# Patient Record
Sex: Female | Born: 1995 | Race: White | Hispanic: No | Marital: Married | Smoking: Never smoker
Health system: Southern US, Community
[De-identification: ages and names within clinical notes are randomized; demographics above are authoritative.]

## PROBLEM LIST (undated history)

## (undated) DIAGNOSIS — J45909 Unspecified asthma, uncomplicated: Secondary | ICD-10-CM

## (undated) DIAGNOSIS — N390 Urinary tract infection, site not specified: Secondary | ICD-10-CM

## (undated) DIAGNOSIS — D649 Anemia, unspecified: Secondary | ICD-10-CM

## (undated) DIAGNOSIS — F32A Depression, unspecified: Secondary | ICD-10-CM

## (undated) DIAGNOSIS — R519 Headache, unspecified: Secondary | ICD-10-CM

## (undated) DIAGNOSIS — F419 Anxiety disorder, unspecified: Secondary | ICD-10-CM

## (undated) DIAGNOSIS — U071 COVID-19: Secondary | ICD-10-CM

## (undated) DIAGNOSIS — E282 Polycystic ovarian syndrome: Secondary | ICD-10-CM

## (undated) HISTORY — DX: Headache, unspecified: R51.9

## (undated) HISTORY — PX: OTHER SURGICAL HISTORY: SHX169

## (undated) HISTORY — PX: TONSILLECTOMY: SUR1361

---
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
## 2018-02-12 ENCOUNTER — Other Ambulatory Visit: Payer: Self-pay | Admitting: Chiropractic Medicine

## 2018-02-12 ENCOUNTER — Ambulatory Visit
Admission: RE | Admit: 2018-02-12 | Discharge: 2018-02-12 | Disposition: A | Discharge status: Home / Self Care | Payer: 59 | Source: Ambulatory Visit | Attending: Chiropractic Medicine | Admitting: Chiropractic Medicine

## 2018-02-12 DIAGNOSIS — M62838 Other muscle spasm: Secondary | ICD-10-CM

## 2018-02-12 DIAGNOSIS — M542 Cervicalgia: Principal | ICD-10-CM

## 2018-02-12 DIAGNOSIS — G8929 Other chronic pain: Secondary | ICD-10-CM

## 2019-09-02 ENCOUNTER — Encounter: Payer: Self-pay | Admitting: Obstetrics and Gynecology

## 2020-02-11 ENCOUNTER — Encounter: Payer: Self-pay | Admitting: Obstetrics and Gynecology

## 2020-02-17 ENCOUNTER — Telehealth: Payer: Self-pay

## 2020-02-17 ENCOUNTER — Other Ambulatory Visit: Payer: Self-pay | Admitting: *Deleted

## 2020-02-17 ENCOUNTER — Other Ambulatory Visit: Payer: Self-pay | Admitting: Obstetrics and Gynecology

## 2020-02-17 DIAGNOSIS — Z3A13 13 weeks gestation of pregnancy: Secondary | ICD-10-CM

## 2020-02-17 DIAGNOSIS — O283 Abnormal ultrasonic finding on antenatal screening of mother: Secondary | ICD-10-CM

## 2020-02-17 DIAGNOSIS — Z363 Encounter for antenatal screening for malformations: Secondary | ICD-10-CM

## 2020-02-17 NOTE — Telephone Encounter (Signed)
Called patient LVM of appointment on 03/01/20 at 7:30am per Dr. Grace Bushy schedule patient for early Anatomy and GC.     If patient calls to re-schedule needs to be scheduled up to [redacted] week GA

## 2020-02-22 ENCOUNTER — Encounter: Payer: Self-pay | Admitting: *Deleted

## 2020-03-01 ENCOUNTER — Other Ambulatory Visit: Payer: 59

## 2020-03-01 ENCOUNTER — Ambulatory Visit: Payer: 59

## 2020-03-05 ENCOUNTER — Encounter: Payer: Self-pay | Admitting: Obstetrics & Gynecology

## 2020-03-08 ENCOUNTER — Encounter (HOSPITAL_BASED_OUTPATIENT_CLINIC_OR_DEPARTMENT_OTHER): Payer: Self-pay | Admitting: Obstetrics & Gynecology

## 2020-03-08 ENCOUNTER — Other Ambulatory Visit (HOSPITAL_COMMUNITY): Payer: Self-pay | Admitting: Obstetrics & Gynecology

## 2020-03-08 ENCOUNTER — Other Ambulatory Visit: Payer: Self-pay | Admitting: Obstetrics & Gynecology

## 2020-03-08 ENCOUNTER — Other Ambulatory Visit: Payer: Self-pay

## 2020-03-08 DIAGNOSIS — Z0373 Encounter for suspected fetal anomaly ruled out: Secondary | ICD-10-CM

## 2020-03-08 NOTE — Progress Notes (Signed)
Spoke w/ via phone for pre-op interview---pt Lab needs dos---- cbc t & s              Lab results------none COVID test ------03-09-2020 900 am Arrive at -------530 am 03-11-2020 NPO after MN No Solid Food.  Clear liquids from MN until---430 am then npo Medications to take morning of surgery -----none Diabetic medication -----n/a Patient Special Instructions -----none Pre-Op special Istructions -----none Patient verbalized understanding of instructions that were given at this phone interview. Patient denies shortness of breath, chest pain, fever, cough at this phone interview.

## 2020-03-09 ENCOUNTER — Other Ambulatory Visit (HOSPITAL_COMMUNITY)
Admission: RE | Admit: 2020-03-09 | Discharge: 2020-03-09 | Disposition: A | Discharge status: Home / Self Care | Payer: Managed Care, Other (non HMO) | Source: Ambulatory Visit | Attending: Obstetrics & Gynecology | Admitting: Obstetrics & Gynecology

## 2020-03-09 DIAGNOSIS — Z20822 Contact with and (suspected) exposure to covid-19: Secondary | ICD-10-CM | POA: Insufficient documentation

## 2020-03-09 DIAGNOSIS — Z01812 Encounter for preprocedural laboratory examination: Secondary | ICD-10-CM | POA: Insufficient documentation

## 2020-03-09 LAB — SARS CORONAVIRUS 2 (TAT 6-24 HRS): SARS Coronavirus 2: NEGATIVE

## 2020-03-09 NOTE — H&P (Signed)
Jody Watson is an 25 y.o. female G1, 14 wks, with fetal anomaly of bilateral cystic hygromas, is here for pregnancy termination. She had NIPT done few days, result is pending but due to high likelihood of fetal anomaly, couple chose to proceed with pregnancy termination.   O(+) per records from prior Gyn Rub NonImmune VZ NonImmune   Patient's last menstrual period was 12/01/2019.    Past Medical History:  Diagnosis Date  . Anemia    none recent  . Anxiety   . Asthma    no inhalers used  . COVID jan 18th 2022   rapid home sob stomach pain n/v diarrhea x 2 weeks allsymptoms resoleved  . Depression     Past Surgical History:  Procedure Laterality Date  . TONSILLECTOMY  as child  . wisdome teeth extraction  age 60    Family History  Problem Relation Age of Onset  . Asthma Father   . Diabetes Father   . Alcohol abuse Maternal Grandfather   . Diabetes Paternal Grandfather     Social History:  reports that she has never smoked. She has never used smokeless tobacco. She reports that she does not drink alcohol and does not use drugs.  Allergies:  Allergies  Allergen Reactions  . Bee Venom Anaphylaxis    No medications prior to admission.    Review of Systems  Height 5\' 3"  (1.6 m), weight 83.9 kg, last menstrual period 12/01/2019, unknown if currently breastfeeding. Physical Exam  Results for orders placed or performed during the hospital encounter of 03/09/20 (from the past 24 hour(s))  SARS CORONAVIRUS 2 (TAT 6-24 HRS) Nasopharyngeal Nasopharyngeal Swab     Status: None   Collection Time: 03/09/20  2:21 PM   Specimen: Nasopharyngeal Swab  Result Value Ref Range   SARS Coronavirus 2 NEGATIVE NEGATIVE    No results found. Physical exam:  A&O x 3, no acute distress. Pleasant HEENT neg, no thyromegaly Lungs CTA bilat CV RRR, S1S2 normal Abdo soft, non tender, non acute Extr no edema/ tenderness Pelvic cx closed   Assessment/Plan: 25 yo G1 at 13+ wks with  fetal malformation noting bilateral cystic hygromas. Possible aneuploidy and poor fetal outcome incl demise later in pregnancy d/w pt and decision is made to proceed with pregnancy termination with suction dilatation and evacuation.  Risks/complications of surgery reviewed incl infection, bleeding, damage to internal organs including bladder, bowels, ureters, blood vessels, other risks from anesthesia, VTE and delayed complications of any surgery, complications in future surgery reviewed.  25, MD  Pt seen and assessed,H&P and plan reviewed, no changes, declines karyotype  --V.Shea Evans, MD

## 2020-03-10 NOTE — Anesthesia Preprocedure Evaluation (Signed)
Anesthesia Evaluation  Patient identified by MRN, date of birth, ID band Patient awake    Reviewed: Allergy & Precautions, NPO status , Patient's Chart, lab work & pertinent test results  History of Anesthesia Complications Negative for: history of anesthetic complications  Airway Mallampati: III  TM Distance: >3 FB Neck ROM: Full    Dental  (+) Dental Advisory Given   Pulmonary asthma (has not needed inhaler in 2 years) ,  03/09/2020 SARS coronavirus NEG COVID positive 01/19/2020   breath sounds clear to auscultation       Cardiovascular negative cardio ROS   Rhythm:Regular Rate:Normal     Neuro/Psych negative neurological ROS     GI/Hepatic negative GI ROS, Neg liver ROS, neg GERD  ,  Endo/Other  negative endocrine ROS  Renal/GU negative Renal ROS     Musculoskeletal   Abdominal   Peds  Hematology negative hematology ROS (+)   Anesthesia Other Findings   Reproductive/Obstetrics (+) Pregnancy                            Anesthesia Physical Anesthesia Plan  ASA: II  Anesthesia Plan: General   Post-op Pain Management:    Induction: Intravenous  PONV Risk Score and Plan: Dexamethasone, Ondansetron and Scopolamine patch - Pre-op  Airway Management Planned: LMA  Additional Equipment: None  Intra-op Plan:   Post-operative Plan:   Informed Consent: I have reviewed the patients History and Physical, chart, labs and discussed the procedure including the risks, benefits and alternatives for the proposed anesthesia with the patient or authorized representative who has indicated his/her understanding and acceptance.     Dental advisory given  Plan Discussed with: CRNA and Surgeon  Anesthesia Plan Comments:        Anesthesia Quick Evaluation

## 2020-03-11 ENCOUNTER — Ambulatory Visit (HOSPITAL_BASED_OUTPATIENT_CLINIC_OR_DEPARTMENT_OTHER)
Admission: RE | Admit: 2020-03-11 | Discharge: 2020-03-11 | Disposition: A | Discharge status: Home / Self Care | Payer: Managed Care, Other (non HMO) | Attending: Obstetrics & Gynecology | Admitting: Obstetrics & Gynecology

## 2020-03-11 ENCOUNTER — Ambulatory Visit (HOSPITAL_BASED_OUTPATIENT_CLINIC_OR_DEPARTMENT_OTHER): Payer: Managed Care, Other (non HMO) | Admitting: Anesthesiology

## 2020-03-11 ENCOUNTER — Ambulatory Visit (HOSPITAL_COMMUNITY)
Admission: RE | Admit: 2020-03-11 | Discharge: 2020-03-11 | Disposition: A | Discharge status: Home / Self Care | Payer: Managed Care, Other (non HMO) | Source: Ambulatory Visit | Attending: Obstetrics & Gynecology | Admitting: Obstetrics & Gynecology

## 2020-03-11 ENCOUNTER — Encounter (HOSPITAL_BASED_OUTPATIENT_CLINIC_OR_DEPARTMENT_OTHER): Payer: Self-pay | Admitting: Obstetrics & Gynecology

## 2020-03-11 ENCOUNTER — Other Ambulatory Visit: Payer: Self-pay

## 2020-03-11 ENCOUNTER — Encounter (HOSPITAL_BASED_OUTPATIENT_CLINIC_OR_DEPARTMENT_OTHER)
Admission: RE | Disposition: A | Discharge status: Home / Self Care | Payer: Self-pay | Source: Home / Self Care | Attending: Obstetrics & Gynecology

## 2020-03-11 DIAGNOSIS — Z0373 Encounter for suspected fetal anomaly ruled out: Secondary | ICD-10-CM

## 2020-03-11 DIAGNOSIS — Z8616 Personal history of COVID-19: Secondary | ICD-10-CM | POA: Insufficient documentation

## 2020-03-11 DIAGNOSIS — Z87892 Personal history of anaphylaxis: Secondary | ICD-10-CM | POA: Diagnosis not present

## 2020-03-11 DIAGNOSIS — Z332 Encounter for elective termination of pregnancy: Secondary | ICD-10-CM | POA: Diagnosis not present

## 2020-03-11 DIAGNOSIS — Z91030 Bee allergy status: Secondary | ICD-10-CM | POA: Diagnosis not present

## 2020-03-11 DIAGNOSIS — O358XX0 Maternal care for other (suspected) fetal abnormality and damage, not applicable or unspecified: Secondary | ICD-10-CM | POA: Diagnosis present

## 2020-03-11 DIAGNOSIS — Z3A14 14 weeks gestation of pregnancy: Secondary | ICD-10-CM | POA: Insufficient documentation

## 2020-03-11 HISTORY — DX: Anemia, unspecified: D64.9

## 2020-03-11 HISTORY — DX: Depression, unspecified: F32.A

## 2020-03-11 HISTORY — PX: DILATION AND EVACUATION: SHX1459

## 2020-03-11 HISTORY — DX: Anxiety disorder, unspecified: F41.9

## 2020-03-11 HISTORY — DX: COVID-19: U07.1

## 2020-03-11 HISTORY — PX: OPERATIVE ULTRASOUND: SHX5996

## 2020-03-11 HISTORY — DX: Unspecified asthma, uncomplicated: J45.909

## 2020-03-11 LAB — CBC
HCT: 40.5 % (ref 36.0–46.0)
Hemoglobin: 13.4 g/dL (ref 12.0–15.0)
MCH: 27.4 pg (ref 26.0–34.0)
MCHC: 33.1 g/dL (ref 30.0–36.0)
MCV: 82.8 fL (ref 80.0–100.0)
Platelets: 247 10*3/uL (ref 150–400)
RBC: 4.89 MIL/uL (ref 3.87–5.11)
RDW: 13.9 % (ref 11.5–15.5)
WBC: 12.5 10*3/uL — ABNORMAL HIGH (ref 4.0–10.5)
nRBC: 0 % (ref 0.0–0.2)

## 2020-03-11 LAB — ABO/RH: ABO/RH(D): O POS

## 2020-03-11 LAB — TYPE AND SCREEN
ABO/RH(D): O POS
Antibody Screen: NEGATIVE

## 2020-03-11 SURGERY — DILATION AND EVACUATION, UTERUS
Anesthesia: General | Site: Vagina

## 2020-03-11 MED ORDER — FENTANYL CITRATE (PF) 100 MCG/2ML IJ SOLN
INTRAMUSCULAR | Status: DC | PRN
  Administered 2020-03-11 (×4): 50 ug via INTRAVENOUS

## 2020-03-11 MED ORDER — PROPOFOL 10 MG/ML IV BOLUS
INTRAVENOUS | Status: AC
  Filled 2020-03-11: qty 20

## 2020-03-11 MED ORDER — MIDAZOLAM HCL 2 MG/2ML IJ SOLN: 0.5000 mg | Freq: Once | INTRAMUSCULAR | Status: DC | PRN

## 2020-03-11 MED ORDER — MIDAZOLAM HCL 5 MG/5ML IJ SOLN
INTRAMUSCULAR | Status: DC | PRN
  Administered 2020-03-11: 2 mg via INTRAVENOUS

## 2020-03-11 MED ORDER — MEPERIDINE HCL 25 MG/ML IJ SOLN: 6.2500 mg | INTRAMUSCULAR | Status: DC | PRN

## 2020-03-11 MED ORDER — PROPOFOL 10 MG/ML IV BOLUS
INTRAVENOUS | Status: DC | PRN
  Administered 2020-03-11: 200 mg via INTRAVENOUS

## 2020-03-11 MED ORDER — SCOPOLAMINE 1 MG/3DAYS TD PT72
MEDICATED_PATCH | TRANSDERMAL | Status: AC
  Filled 2020-03-11: qty 1

## 2020-03-11 MED ORDER — CEFAZOLIN SODIUM-DEXTROSE 2-4 GM/100ML-% IV SOLN
INTRAVENOUS | Status: AC
  Filled 2020-03-11: qty 100

## 2020-03-11 MED ORDER — LIDOCAINE 2% (20 MG/ML) 5 ML SYRINGE
INTRAMUSCULAR | Status: AC
  Filled 2020-03-11: qty 5

## 2020-03-11 MED ORDER — FENTANYL CITRATE (PF) 100 MCG/2ML IJ SOLN
INTRAMUSCULAR | Status: AC
  Filled 2020-03-11: qty 2

## 2020-03-11 MED ORDER — HYDROMORPHONE HCL 1 MG/ML IJ SOLN
0.2500 mg | INTRAMUSCULAR | Status: DC | PRN
  Administered 2020-03-11: 0.25 mg via INTRAVENOUS

## 2020-03-11 MED ORDER — OXYCODONE HCL 5 MG PO TABS: 5.0000 mg | ORAL_TABLET | Freq: Once | ORAL | Status: DC | PRN

## 2020-03-11 MED ORDER — PROMETHAZINE HCL 25 MG/ML IJ SOLN: 6.2500 mg | INTRAMUSCULAR | Status: DC | PRN

## 2020-03-11 MED ORDER — IBUPROFEN 200 MG PO TABS: 600.0000 mg | ORAL_TABLET | Freq: Four times a day (QID) | ORAL | 0 refills | Status: DC | PRN

## 2020-03-11 MED ORDER — ACETAMINOPHEN 500 MG PO TABS
ORAL_TABLET | ORAL | Status: AC
  Filled 2020-03-11: qty 2

## 2020-03-11 MED ORDER — CHLOROPROCAINE HCL 1 % IJ SOLN
INTRAMUSCULAR | Status: DC | PRN
  Administered 2020-03-11: 20 mL

## 2020-03-11 MED ORDER — OXYCODONE HCL 5 MG/5ML PO SOLN
5.0000 mg | Freq: Once | ORAL | Status: DC | PRN
Start: 2020-03-11 — End: 2020-03-11

## 2020-03-11 MED ORDER — ONDANSETRON HCL 4 MG/2ML IJ SOLN
INTRAMUSCULAR | Status: AC
  Filled 2020-03-11: qty 2

## 2020-03-11 MED ORDER — DEXAMETHASONE SODIUM PHOSPHATE 10 MG/ML IJ SOLN
INTRAMUSCULAR | Status: AC
  Filled 2020-03-11: qty 1

## 2020-03-11 MED ORDER — ONDANSETRON HCL 4 MG/2ML IJ SOLN
INTRAMUSCULAR | Status: DC | PRN
  Administered 2020-03-11: 4 mg via INTRAVENOUS

## 2020-03-11 MED ORDER — DEXAMETHASONE SODIUM PHOSPHATE 10 MG/ML IJ SOLN
INTRAMUSCULAR | Status: DC | PRN
  Administered 2020-03-11: 10 mg via INTRAVENOUS

## 2020-03-11 MED ORDER — LIDOCAINE 2% (20 MG/ML) 5 ML SYRINGE
INTRAMUSCULAR | Status: DC | PRN
  Administered 2020-03-11: 100 mg via INTRAVENOUS

## 2020-03-11 MED ORDER — CEFAZOLIN SODIUM-DEXTROSE 2-4 GM/100ML-% IV SOLN
2.0000 g | INTRAVENOUS | Status: AC
  Administered 2020-03-11: 2 g via INTRAVENOUS

## 2020-03-11 MED ORDER — ACETAMINOPHEN 500 MG PO TABS
1000.0000 mg | ORAL_TABLET | Freq: Once | ORAL | Status: AC
  Administered 2020-03-11: 1000 mg via ORAL

## 2020-03-11 MED ORDER — LACTATED RINGERS IV SOLN: INTRAVENOUS | Status: DC

## 2020-03-11 MED ORDER — POVIDONE-IODINE 10 % EX SWAB: 2.0000 "application " | Freq: Once | CUTANEOUS | Status: DC

## 2020-03-11 MED ORDER — MIDAZOLAM HCL 2 MG/2ML IJ SOLN
INTRAMUSCULAR | Status: AC
  Filled 2020-03-11: qty 2

## 2020-03-11 MED ORDER — HYDROMORPHONE HCL 1 MG/ML IJ SOLN
INTRAMUSCULAR | Status: AC
  Filled 2020-03-11: qty 1

## 2020-03-11 MED ORDER — SCOPOLAMINE 1 MG/3DAYS TD PT72: 1.0000 | MEDICATED_PATCH | TRANSDERMAL | Status: DC

## 2020-03-11 MED ORDER — METHYLERGONOVINE MALEATE 0.2 MG/ML IJ SOLN
INTRAMUSCULAR | Status: DC | PRN
  Administered 2020-03-11: .2 mg via INTRAMUSCULAR

## 2020-03-11 MED ORDER — KETOROLAC TROMETHAMINE 30 MG/ML IJ SOLN
INTRAMUSCULAR | Status: DC | PRN
  Administered 2020-03-11: 30 mg via INTRAVENOUS

## 2020-03-11 MED ORDER — KETOROLAC TROMETHAMINE 30 MG/ML IJ SOLN
INTRAMUSCULAR | Status: AC
  Filled 2020-03-11: qty 1

## 2020-03-11 SURGICAL SUPPLY — 24 items
ADAPTER VACURETTE TBG SET 14 (CANNULA) ×3 IMPLANT
COVER WAND RF STERILE (DRAPES) ×3 IMPLANT
DECANTER SPIKE VIAL GLASS SM (MISCELLANEOUS) ×3 IMPLANT
GAUZE 4X4 16PLY RFD (DISPOSABLE) ×3 IMPLANT
GLOVE SURG ENC MOIS LTX SZ7 (GLOVE) ×3 IMPLANT
GLOVE SURG UNDER POLY LF SZ7 (GLOVE) ×6 IMPLANT
GOWN STRL REUS W/TWL LRG LVL3 (GOWN DISPOSABLE) ×6 IMPLANT
KIT BERKELEY 1ST TRI 3/8 NO TR (MISCELLANEOUS) ×3 IMPLANT
KIT BERKELEY 1ST TRIMESTER 3/8 (MISCELLANEOUS) IMPLANT
KIT TURNOVER CYSTO (KITS) ×3 IMPLANT
Med GYN Disposable Rigid Curette Curved ×6 IMPLANT
NS IRRIG 1000ML POUR BTL (IV SOLUTION) IMPLANT
NS IRRIG 500ML POUR BTL (IV SOLUTION) ×3 IMPLANT
PACK VAGINAL MINOR WOMEN LF (CUSTOM PROCEDURE TRAY) ×3 IMPLANT
PAD OB MATERNITY 4.3X12.25 (PERSONAL CARE ITEMS) ×3 IMPLANT
PAD PREP 24X48 CUFFED NSTRL (MISCELLANEOUS) ×3 IMPLANT
SET BERKELEY SUCTION TUBING (SUCTIONS) ×3 IMPLANT
SUT VICRYL 0 UR6 27IN ABS (SUTURE) ×3 IMPLANT
TOWEL OR 17X26 10 PK STRL BLUE (TOWEL DISPOSABLE) ×3 IMPLANT
TUBE VACURETTE 2ND TRIMESTER (CANNULA) ×6 IMPLANT
VACURETTE 10 RIGID CVD (CANNULA) IMPLANT
VACURETTE 7MM CVD STRL WRAP (CANNULA) IMPLANT
VACURETTE 8 RIGID CVD (CANNULA) IMPLANT
VACURETTE 9 RIGID CVD (CANNULA) IMPLANT

## 2020-03-11 NOTE — Discharge Instructions (Signed)
Post Anesthesia Home Care Instructions  Activity: Get plenty of rest for the remainder of the day. A responsible individual must stay with you for 24 hours following the procedure.  For the next 24 hours, DO NOT: -Drive a car -Operate machinery -Drink alcoholic beverages -Take any medication unless instructed by your physician -Make any legal decisions or sign important papers.  Meals: Start with liquid foods such as gelatin or soup. Progress to regular foods as tolerated. Avoid greasy, spicy, heavy foods. If nausea and/or vomiting occur, drink only clear liquids until the nausea and/or vomiting subsides. Call your physician if vomiting continues.  Special Instructions/Symptoms: Your throat may feel dry or sore from the anesthesia or the breathing tube placed in your throat during surgery. If this causes discomfort, gargle with warm salt water. The discomfort should disappear within 24 hours.  If you had a scopolamine patch placed behind your ear for the management of post- operative nausea and/or vomiting:  1. The medication in the patch is effective for 72 hours, after which it should be removed.  Wrap patch in a tissue and discard in the trash. Wash hands thoroughly with soap and water. 2. You may remove the patch earlier than 72 hours if you experience unpleasant side effects which may include dry mouth, dizziness or visual disturbances. 3. Avoid touching the patch. Wash your hands with soap and water after contact with the patch.        Dilation and Curettage or Vacuum Curettage, Care After This sheet gives you information about how to care for yourself after your procedure. Your doctor may also give you more specific instructions. If you have problems or questions, contact your doctor. What can I expect after the procedure? After the procedure, it is common to have:  Mild pain or cramping.  Some bleeding or spotting from the vagina. These may last for up to 2 weeks. Follow  these instructions at home: Medicines  Take over-the-counter and prescription medicines only as told by your doctor. This is very important if you take blood-thinning medicine.  Ask your doctor if the medicine prescribed to you requires you to avoid driving or using machinery. Activity  If you were given a medicine to help you relax (sedative) during your procedure, it can affect you for many hours. Do not drive or use machinery until your doctor says that it is safe.  Rest as told by your doctor.  Do not sit for a long time without moving. Get up to take short walks every 1-2 hours. This is important. Ask for help if you feel weak or unsteady.  Do not lift anything that is heavier than 10 lb (4.5 kg), or the limit that you are told, until your doctor says that it is safe.  Return to your normal activities as told by your doctor. Ask your doctor what activities are safe for you.   Lifestyle For at least 2 weeks, or as long as told by your doctor:  Do not douche.  Do not use tampons.  Do not have sex. General instructions  Wear compression stockings as told by your doctor.  It is up to you to get the results of your procedure. Ask your doctor, or the department that is doing the procedure, when your results will be ready.  Keep all follow-up visits as told by your doctor. This is important. Contact a doctor if:  You have very bad cramps that get worse or do not get better with medicine.  You   have very bad pain in your belly (abdomen).  You cannot drink fluids without vomiting.  You have pain in a different part of your pelvis. The pelvis is the area just above your thighs.  You have fluid from your vagina that smells bad.  You have a rash. Get help right away if:  You are bleeding a lot from your vagina. A lot of bleeding means soaking more than one sanitary pad in 1 hour for 2 hours in a row.  You have a fever that is above 100.4F (38.0C).  Your belly feels very  tender or hard.  You have chest pain.  You have trouble breathing.  You feel dizzy.  You feel light-headed.  You pass out (faint).  You have pain in your neck or shoulder area. These symptoms may be an emergency. Do not wait to see if the symptoms will go away. Get medical help right away. Call your local emergency services (911 in the [place] Do not drive yourself to the hospital. Summary  After your procedure, it is common to have pain or cramping. It is also common to have bleeding or spotting from your vagina.  Rest as told. Do not sit for a long time without moving. Get up to take short walks every 1-2 hours.  Do not lift anything that is heavier than 10 lb (4.5 kg), or the limit that you are told.  Contact your doctor if you have fluid from your vagina that smells bad.  Get help right away if you develop any problems from the procedure. Ask your doctor what problems to watch for. This information is not intended to replace advice given to you by your health care provider. Make sure you discuss any questions you have with your health care provider. Document Revised: 01/20/2019 Document Reviewed: 01/20/2019 Elsevier Patient Education  2021 [employer]  

## 2020-03-11 NOTE — Anesthesia Postprocedure Evaluation (Signed)
Anesthesia Post Note  Patient: Almedia Cordell  Procedure(s) Performed: DILATATION AND EVACUATION (N/A Vagina ) OPERATIVE ULTRASOUND (N/A Abdomen)     Patient location during evaluation: Phase II Anesthesia Type: General Level of consciousness: awake and alert, patient cooperative and oriented Pain management: pain level controlled Vital Signs Assessment: post-procedure vital signs reviewed and stable Respiratory status: spontaneous breathing, nonlabored ventilation and respiratory function stable Cardiovascular status: blood pressure returned to baseline and stable Postop Assessment: no apparent nausea or vomiting, able to ambulate and adequate PO intake Anesthetic complications: no   No complications documented.  Last Vitals:  Vitals:   03/11/20 0930 03/11/20 1015  BP: 124/82 119/78  Pulse: 95 92  Resp: 12 16  Temp:  36.6 C  SpO2: 99% 98%    Last Pain:  Vitals:   03/11/20 1015  TempSrc:   PainSc: 1                  Josip Merolla,E. Jeylin Woodmansee

## 2020-03-11 NOTE — Transfer of Care (Signed)
Immediate Anesthesia Transfer of Care Note  Patient: Jody Watson  Procedure(s) Performed: DILATATION AND EVACUATION (N/A Vagina ) OPERATIVE ULTRASOUND (N/A Abdomen)  Patient Location: PACU  Anesthesia Type:General  Level of Consciousness: awake, alert , oriented and patient cooperative  Airway & Oxygen Therapy: Patient Spontanous Breathing  Post-op Assessment: Report given to RN and Post -op Vital signs reviewed and stable  Post vital signs: Reviewed and stable  Last Vitals:  Vitals Value Taken Time  BP 116/79 03/11/20 0845  Temp 37.1 C 03/11/20 0844  Pulse 105 03/11/20 0848  Resp 15 03/11/20 0848  SpO2 98 % 03/11/20 0848  Vitals shown include unvalidated device data.  Last Pain:  Vitals:   03/11/20 0538  TempSrc: Oral         Complications: No complications documented.

## 2020-03-11 NOTE — Op Note (Signed)
Preoperative diagnosis: 14 weeks pregnancy, fetal anomaly, bilateral cystic hygromas Postop diagnosis: as above.  Procedure: Suction D&E with ultrasound guidance  Anesthesia General LMA and 20 cc Nesacaine Paracervical block  Surgeon: Shea Evans, MD Assistant: none  IV fluids 1000 cc LR Estimated blood loss 700 cc in suction canister  Urine output: voided preop Complications none Condition stable Disposition PACU  Specimen: products of conception sent to pathology.  Procedure Indication: 25 yo with early pregnancy noting bilateral cystic hygromas at another practice and we noted same findings but worsening cystic hydromas involving entire crown rum length at 13 weeks. NIPT was done but result not back. Couple chose to proceed with pregnancy termination due to ultrasound findings and that NIPS would not rule out all chromosomal abnormalities. She declined karyotype on specimen today.   Decision was made to proceed with suction D&E. Risks/ complications of surgery including infection, bleeding, damage to internal organs including uterine perforation, Asherman syndrome were reviewed. She voiced understanding and gave informed written consent.  Patient was brought to the operating room with IV running. She received preop 2 gm Ancef.  She underwent general anesthesia with LMA without complications. She was given dorsolithotomy position. Parts were prepped and draped in standard fashion. Bimanual exam and ultrasound noted revealed uterus to be anteverted and 14 week size.  [employer] consultant used to expose cervix  and was grasped with single-tooth tenaculum. Bilateral cervical block given with 20 cc plain Nesacaine.  Cervical os dilates with some difficulty as patient didn't take Cytotec to prep her cervix. Ultrasound guidance helped. Cervix was gradually dilated to #41 [place] dilator. Cervical bucket handle tear was created as I tried last dilated and was stitched with 0-Vocryl at the end  with gret hemostasis.  A # 14 suction curette was introduced in the uterine cavity and suction evacuation of products of conception was done in multiple step wise fashion. Some tougher parts removed with Ring clamps. Gentle sharp curettage was performed. Suction cannula was reintroduced and cavity was emptied that was confirmed with ultrasound.  There was no active bleeding but Methergine 0.2 mg IM given as prophylaxis considering gestational age. Cervical laceration (anterior lip, tenaculum tear) was sutured with two figure of 8 stitches of 0-Vicryl. Procedure was complete.  All counts are correct x2. Specimen products of conception. Patient brought to the recovery room in stable condition.   Patient will be discharged home today. Findings and warning signs and follow up, restrictions discussed with couple in recovery room. I performed this surgery.  V.Kali Deadwyler, MD.

## 2020-03-11 NOTE — Anesthesia Procedure Notes (Signed)
Procedure Name: LMA Insertion Date/Time: 03/11/2020 7:43 AM Performed by: Bishop Limbo, CRNA Pre-anesthesia Checklist: Patient identified, Emergency Drugs available, Suction available and Patient being monitored Patient Re-evaluated:Patient Re-evaluated prior to induction Oxygen Delivery Method: Circle System Utilized Preoxygenation: Pre-oxygenation with 100% oxygen Induction Type: IV induction Ventilation: Mask ventilation without difficulty LMA: LMA inserted LMA Size: 4.0 Number of attempts: 1 Placement Confirmation: positive ETCO2 Tube secured with: Tape Dental Injury: Teeth and Oropharynx as per pre-operative assessment

## 2020-03-14 ENCOUNTER — Encounter (HOSPITAL_BASED_OUTPATIENT_CLINIC_OR_DEPARTMENT_OTHER): Payer: Self-pay | Admitting: Obstetrics & Gynecology

## 2020-03-14 LAB — SURGICAL PATHOLOGY

## 2020-08-30 ENCOUNTER — Other Ambulatory Visit: Payer: Self-pay | Admitting: Obstetrics & Gynecology

## 2020-08-30 ENCOUNTER — Telehealth: Payer: Self-pay

## 2020-08-30 DIAGNOSIS — O30031 Twin pregnancy, monochorionic/diamniotic, first trimester: Secondary | ICD-10-CM

## 2020-08-30 NOTE — Telephone Encounter (Signed)
Called pt to schedule twin detail with our clinic. Left vm for pt to call back and schedule.

## 2020-09-29 ENCOUNTER — Encounter: Payer: Self-pay | Admitting: *Deleted

## 2020-09-30 ENCOUNTER — Encounter: Payer: Self-pay | Admitting: *Deleted

## 2020-09-30 ENCOUNTER — Ambulatory Visit: Payer: Managed Care, Other (non HMO) | Attending: Obstetrics & Gynecology

## 2020-09-30 ENCOUNTER — Other Ambulatory Visit: Payer: Self-pay | Admitting: *Deleted

## 2020-09-30 ENCOUNTER — Ambulatory Visit: Payer: Managed Care, Other (non HMO) | Admitting: *Deleted

## 2020-09-30 ENCOUNTER — Ambulatory Visit (HOSPITAL_BASED_OUTPATIENT_CLINIC_OR_DEPARTMENT_OTHER): Payer: Managed Care, Other (non HMO) | Admitting: Obstetrics

## 2020-09-30 ENCOUNTER — Other Ambulatory Visit: Payer: Self-pay

## 2020-09-30 VITALS — BP 133/78 | HR 130

## 2020-09-30 DIAGNOSIS — O30002 Twin pregnancy, unspecified number of placenta and unspecified number of amniotic sacs, second trimester: Secondary | ICD-10-CM | POA: Insufficient documentation

## 2020-09-30 DIAGNOSIS — O352XX0 Maternal care for (suspected) hereditary disease in fetus, not applicable or unspecified: Secondary | ICD-10-CM | POA: Diagnosis not present

## 2020-09-30 DIAGNOSIS — O30032 Twin pregnancy, monochorionic/diamniotic, second trimester: Secondary | ICD-10-CM

## 2020-09-30 DIAGNOSIS — Z3A19 19 weeks gestation of pregnancy: Secondary | ICD-10-CM | POA: Diagnosis present

## 2020-09-30 DIAGNOSIS — O30039 Twin pregnancy, monochorionic/diamniotic, unspecified trimester: Secondary | ICD-10-CM

## 2020-09-30 DIAGNOSIS — O30031 Twin pregnancy, monochorionic/diamniotic, first trimester: Secondary | ICD-10-CM | POA: Diagnosis present

## 2020-09-30 DIAGNOSIS — Z3689 Encounter for other specified antenatal screening: Secondary | ICD-10-CM | POA: Diagnosis not present

## 2020-09-30 NOTE — Progress Notes (Signed)
MFM Note  Jody Watson was seen due to a spontaneously conceived twin pregnancy.  Her last pregnancy was complicated by a fetus with bilateral cystic hygromas.  She denies any other significant past medical history and denies any problems in her current pregnancy.  She reports that she had a quad screen that showed a low risk for Down syndrome and trisomy 6.  A thin dividing membrane was noted separating the two fetuses along with a single placenta, indicating that these are monochorionic, diamniotic twins.  The fetal growth and amniotic fluid level appeared appropriate for her gestational age for both fetuses.  The views of the fetal anatomy were suboptimal today due to the fetal positions.   The limitations of ultrasound in the detection of all anomalies including fetal aneuploidy was discussed with the patient today.  The implications and management of monochorionic twins was discussed. The 10% to 15% risk of twin to twin transfusion syndrome seen in monochorionic, diamniotic twins was discussed today.  The implications and management of twin to twin transfusion syndrome (TTTS) should she develop this complication was also discussed.  She was advised that we will continue to follow her closely with serial ultrasounds to assess for signs of TTTS.  She was advised that management of twin pregnancies will involve frequent ultrasound exams to assess the fetal growth and amniotic fluid level.  We will continue to follow her with ultrasounds once every 2 weeks to assess for signs of TTTS. Weekly fetal testing should be started at around 32 weeks.  Delivery for uncomplicated monochorionic twins is recommended at around 37 weeks.  The increased risk of preeclampsia, gestational diabetes, and preterm birth/labor associated with twin pregnancies was discussed.  She was advised that we will continue to follow her closely to assess for these conditions.   As pregnancies with multiple gestations are at  increased risk for developing preeclampsia, she was advised to start taking a daily baby aspirin (81 mg per day) to decrease her risk of developing preeclampsia  as soon as possible.  A follow-up exam was scheduled in 2 weeks.   She already has a fetal echocardiogram scheduled with [facility] on November 03, 2020.  A total of 30 minutes was spent counseling and coordinating the care for this patient.  Greater than 50% of the time was spent in direct face-to-face contact.  Recommendations:  Ultrasounds every 2 weeks to screen for TTTS Fetal echocardiogram Start daily baby aspirin (81 mg) for preeclampsia prophylaxis Weekly fetal testing starting at 32 weeks Delivery by 37 weeks

## 2020-10-14 ENCOUNTER — Encounter: Payer: Self-pay | Admitting: *Deleted

## 2020-10-14 ENCOUNTER — Other Ambulatory Visit: Payer: Self-pay

## 2020-10-14 ENCOUNTER — Ambulatory Visit: Payer: Managed Care, Other (non HMO) | Admitting: *Deleted

## 2020-10-14 ENCOUNTER — Ambulatory Visit: Payer: Managed Care, Other (non HMO) | Attending: Obstetrics

## 2020-10-14 VITALS — BP 121/68 | HR 109

## 2020-10-14 DIAGNOSIS — O30039 Twin pregnancy, monochorionic/diamniotic, unspecified trimester: Secondary | ICD-10-CM | POA: Diagnosis not present

## 2020-10-14 DIAGNOSIS — O352XX0 Maternal care for (suspected) hereditary disease in fetus, not applicable or unspecified: Secondary | ICD-10-CM

## 2020-10-14 DIAGNOSIS — O30032 Twin pregnancy, monochorionic/diamniotic, second trimester: Secondary | ICD-10-CM | POA: Diagnosis not present

## 2020-10-14 DIAGNOSIS — Z3A21 21 weeks gestation of pregnancy: Secondary | ICD-10-CM | POA: Diagnosis not present

## 2020-10-28 ENCOUNTER — Ambulatory Visit: Payer: Managed Care, Other (non HMO) | Admitting: *Deleted

## 2020-10-28 ENCOUNTER — Other Ambulatory Visit: Payer: Self-pay | Admitting: *Deleted

## 2020-10-28 ENCOUNTER — Encounter: Payer: Self-pay | Admitting: *Deleted

## 2020-10-28 ENCOUNTER — Ambulatory Visit: Payer: Managed Care, Other (non HMO) | Attending: Obstetrics

## 2020-10-28 ENCOUNTER — Other Ambulatory Visit: Payer: Self-pay

## 2020-10-28 VITALS — BP 133/79 | HR 115

## 2020-10-28 DIAGNOSIS — Z362 Encounter for other antenatal screening follow-up: Secondary | ICD-10-CM

## 2020-10-28 DIAGNOSIS — O30039 Twin pregnancy, monochorionic/diamniotic, unspecified trimester: Secondary | ICD-10-CM

## 2020-10-28 DIAGNOSIS — Z3A23 23 weeks gestation of pregnancy: Secondary | ICD-10-CM | POA: Diagnosis not present

## 2020-10-28 DIAGNOSIS — O352XX0 Maternal care for (suspected) hereditary disease in fetus, not applicable or unspecified: Secondary | ICD-10-CM

## 2020-10-28 DIAGNOSIS — O30032 Twin pregnancy, monochorionic/diamniotic, second trimester: Secondary | ICD-10-CM

## 2020-11-09 ENCOUNTER — Ambulatory Visit
Admission: EM | Admit: 2020-11-09 | Discharge: 2020-11-09 | Disposition: A | Discharge status: Home / Self Care | Payer: Managed Care, Other (non HMO) | Attending: Family Medicine | Admitting: Family Medicine

## 2020-11-09 ENCOUNTER — Other Ambulatory Visit: Payer: Self-pay

## 2020-11-09 DIAGNOSIS — Z20818 Contact with and (suspected) exposure to other bacterial communicable diseases: Secondary | ICD-10-CM | POA: Insufficient documentation

## 2020-11-09 DIAGNOSIS — J029 Acute pharyngitis, unspecified: Secondary | ICD-10-CM | POA: Insufficient documentation

## 2020-11-09 LAB — POCT RAPID STREP A (OFFICE): Rapid Strep A Screen: NEGATIVE

## 2020-11-09 NOTE — ED Provider Notes (Signed)
[facility]    CSN: [id number] Arrival date & time: 11/09/20  1259      History   Chief Complaint No chief complaint on file.   HPI Jody Watson is a 25 y.o. female.   Patient presenting today with 2-day history of left-sided sore throat, now with pain radiating up toward the ear.  She denies fever, chills, body aches, cough, congestion, chest tightness, shortness of breath, abdominal pain, nausea vomiting or diarrhea.  So far taking allergy medication and lozenges with minimal relief.  Does have a history of seasonal allergies, asthma compliant with regimen.  No known sick contacts recently.  Notes that she is [redacted] weeks pregnant.   Past Medical History:  Diagnosis Date   Anemia    none recent   Anxiety    Asthma    no inhalers used   COVID jan 18th 2022   rapid home sob stomach pain n/v diarrhea x 2 weeks allsymptoms resoleved   Depression    Headache     There are no problems to display for this patient.   Past Surgical History:  Procedure Laterality Date   DILATION AND EVACUATION N/A 03/11/2020   Procedure: DILATATION AND EVACUATION;  Surgeon: Azucena Fallen, MD;  Location: [facility];  Service: Gynecology;  Laterality: N/A;   OPERATIVE ULTRASOUND N/A 03/11/2020   Procedure: OPERATIVE ULTRASOUND;  Surgeon: Azucena Fallen, MD;  Location: [facility];  Service: Gynecology;  Laterality: N/A;   TONSILLECTOMY  as child   wisdome teeth extraction  age 25    OB History     Gravida  2   Para      Term      Preterm      AB  1   Living  0      SAB      IAB  1   Ectopic      Multiple      Live Births               Home Medications    Prior to Admission medications   Medication Sig Start Date End Date Taking? Authorizing Provider  aspirin EC 81 MG tablet Take 81 mg by mouth daily. Swallow whole.    [provider]  EPINEPHrine 0.3 mg/0.3 mL IJ SOAJ injection Inject 0.3 mg into the muscle as  needed for anaphylaxis.    [provider]  Pediatric Multivit-Minerals-C (MULTIVITAMIN GUMMIES CHILDRENS PO) Take 2 tablets by mouth daily.    [provider]  promethazine (PHENERGAN) 25 MG tablet Take 25 mg by mouth every 6 (six) hours as needed for nausea or vomiting.    [provider]  sertraline (ZOLOFT) 25 MG tablet Take 25 mg by mouth daily.    [provider]    Family History Family History  Problem Relation Age of Onset   Asthma Father    Diabetes Father    Alcohol abuse Maternal Grandfather    Diabetes Paternal Grandfather     Social History Social History   Tobacco Use   Smoking status: Never   Smokeless tobacco: Never  Vaping Use   Vaping Use: Never used  Substance Use Topics   Alcohol use: No   Drug use: No     Allergies   Bee venom   Review of Systems Review of Systems Per HPI  Physical Exam Triage Vital Signs ED Triage Vitals  Enc Vitals Group     BP 11/09/20 1421 117/82  Pulse Rate 11/09/20 1421 (!) 121     Resp 11/09/20 1421 18     Temp 11/09/20 1421 98.5 F (36.9 C)     Temp Source 11/09/20 1421 Oral     SpO2 11/09/20 1421 97 %     Weight --      Height --      Head Circumference --      Peak Flow --      Pain Score 11/09/20 1419 4     Pain Loc --      Pain Edu? --      Excl. in GC? --    No data found.  Updated Vital Signs BP 117/82 (BP Location: Right Arm)   Pulse (!) 121   Temp 98.5 F (36.9 C) (Oral)   Resp 18   LMP 12/01/2019   SpO2 97%   Visual Acuity Right Eye Distance:   Left Eye Distance:   Bilateral Distance:    Right Eye Near:   Left Eye Near:    Bilateral Near:     Physical Exam Vitals and nursing note reviewed.  Constitutional:      Appearance: Normal appearance. She is not ill-appearing.  HENT:     Head: Atraumatic.     Right Ear: Tympanic membrane normal.     Left Ear: Tympanic membrane normal.     Nose: Nose normal.     Mouth/Throat:     Mouth: Mucous  membranes are moist.     Pharynx: Oropharynx is clear. Posterior oropharyngeal erythema present. No oropharyngeal exudate.     Comments: Minimal posterior oropharyngeal erythema, no exudates, absent tonsils due to status post tonsillectomy.  Uvula midline, oral airway patent Eyes:     Extraocular Movements: Extraocular movements intact.     Conjunctiva/sclera: Conjunctivae normal.  Cardiovascular:     Rate and Rhythm: Normal rate and regular rhythm.     Heart sounds: Normal heart sounds.  Pulmonary:     Effort: Pulmonary effort is normal.     Breath sounds: Normal breath sounds. No wheezing or rales.  Musculoskeletal:        General: Normal range of motion.     Cervical back: Normal range of motion and neck supple.  Skin:    General: Skin is warm and dry.  Neurological:     Mental Status: She is alert and oriented to person, place, and time.  Psychiatric:        Mood and Affect: Mood normal.        Thought Content: Thought content normal.        Judgment: Judgment normal.     UC Treatments / Results  Labs (all labs ordered are listed, but only abnormal results are displayed) Labs Reviewed  CULTURE, GROUP A STREP [facility])  POCT RAPID STREP A (OFFICE)    EKG   Radiology No results found.  Procedures Procedures (including critical care time)  Medications Ordered in UC Medications - No data to display  Initial Impression / Assessment and Plan / UC Course  I have reviewed the triage vital signs and the nursing notes.  Pertinent labs & imaging results that were available during my care of the patient were reviewed by me and considered in my medical decision making (see chart for details).      Vitals and exam reassuring, rapid strep negative, throat culture pending.  She declines viral testing today.  Continue salt water gargles, throat lozenges, allergy regimen.  Return for acutely worsening symptoms. Final Clinical  Impressions(s) / UC Diagnoses   Final diagnoses:   Exposure to strep throat  Sore throat   Discharge Instructions   None    ED Prescriptions   None    PDMP not reviewed this encounter.   Volney American, [place] 11/09/20 1514

## 2020-11-09 NOTE — ED Triage Notes (Signed)
Patient states she has pain in her throat on the left side that started 2 days ago. Had tonsils were removed about 10 years ago and hasn't had strep throat. Also is having pain in left ear. She would like to be swabbed for strep and no covid or flu today.  She is [redacted] weeks pregnant.

## 2020-11-12 LAB — CULTURE, GROUP A STREP (THRC)

## 2020-11-14 ENCOUNTER — Other Ambulatory Visit: Payer: Self-pay

## 2020-11-14 ENCOUNTER — Other Ambulatory Visit: Payer: Self-pay | Admitting: *Deleted

## 2020-11-14 ENCOUNTER — Ambulatory Visit: Payer: Managed Care, Other (non HMO) | Attending: Obstetrics and Gynecology

## 2020-11-14 ENCOUNTER — Encounter: Payer: Self-pay | Admitting: *Deleted

## 2020-11-14 ENCOUNTER — Ambulatory Visit: Payer: Managed Care, Other (non HMO) | Admitting: *Deleted

## 2020-11-14 VITALS — BP 142/81 | HR 134

## 2020-11-14 DIAGNOSIS — O30039 Twin pregnancy, monochorionic/diamniotic, unspecified trimester: Secondary | ICD-10-CM | POA: Insufficient documentation

## 2020-11-14 DIAGNOSIS — Z3A25 25 weeks gestation of pregnancy: Secondary | ICD-10-CM | POA: Diagnosis not present

## 2020-11-14 DIAGNOSIS — O352XX0 Maternal care for (suspected) hereditary disease in fetus, not applicable or unspecified: Secondary | ICD-10-CM | POA: Diagnosis not present

## 2020-11-14 DIAGNOSIS — O30032 Twin pregnancy, monochorionic/diamniotic, second trimester: Secondary | ICD-10-CM

## 2020-11-28 ENCOUNTER — Ambulatory Visit: Payer: Managed Care, Other (non HMO) | Admitting: *Deleted

## 2020-11-28 ENCOUNTER — Ambulatory Visit: Payer: Managed Care, Other (non HMO) | Attending: Obstetrics and Gynecology

## 2020-11-28 ENCOUNTER — Other Ambulatory Visit: Payer: Self-pay

## 2020-11-28 ENCOUNTER — Other Ambulatory Visit: Payer: Self-pay | Admitting: Obstetrics & Gynecology

## 2020-11-28 ENCOUNTER — Encounter: Payer: Self-pay | Admitting: *Deleted

## 2020-11-28 VITALS — BP 132/82 | HR 131

## 2020-11-28 DIAGNOSIS — O30032 Twin pregnancy, monochorionic/diamniotic, second trimester: Secondary | ICD-10-CM

## 2020-11-28 DIAGNOSIS — Z3A27 27 weeks gestation of pregnancy: Secondary | ICD-10-CM

## 2020-11-28 DIAGNOSIS — O30039 Twin pregnancy, monochorionic/diamniotic, unspecified trimester: Secondary | ICD-10-CM | POA: Diagnosis present

## 2020-11-28 DIAGNOSIS — O352XX0 Maternal care for (suspected) hereditary disease in fetus, not applicable or unspecified: Secondary | ICD-10-CM | POA: Diagnosis not present

## 2020-11-28 DIAGNOSIS — O99012 Anemia complicating pregnancy, second trimester: Secondary | ICD-10-CM

## 2020-12-07 ENCOUNTER — Non-Acute Institutional Stay (HOSPITAL_COMMUNITY)
Admission: RE | Admit: 2020-12-07 | Discharge: 2020-12-07 | Disposition: A | Discharge status: Home / Self Care | Payer: Managed Care, Other (non HMO) | Source: Ambulatory Visit | Attending: Internal Medicine | Admitting: Internal Medicine

## 2020-12-07 ENCOUNTER — Other Ambulatory Visit: Payer: Self-pay

## 2020-12-07 DIAGNOSIS — O99012 Anemia complicating pregnancy, second trimester: Secondary | ICD-10-CM | POA: Insufficient documentation

## 2020-12-07 MED ORDER — DIPHENHYDRAMINE HCL 25 MG PO CAPS
25.0000 mg | ORAL_CAPSULE | ORAL | Status: DC
  Administered 2020-12-07: 25 mg via ORAL
  Filled 2020-12-07: qty 1

## 2020-12-07 MED ORDER — ACETAMINOPHEN 500 MG PO TABS
500.0000 mg | ORAL_TABLET | ORAL | Status: DC
Start: 2020-12-07 — End: 2020-12-08
  Administered 2020-12-07: 500 mg via ORAL
  Filled 2020-12-07: qty 1

## 2020-12-07 MED ORDER — SODIUM CHLORIDE 0.9 % IV SOLN
500.0000 mg | INTRAVENOUS | Status: DC
  Administered 2020-12-07: 500 mg via INTRAVENOUS
  Filled 2020-12-07: qty 25

## 2020-12-07 MED ORDER — SODIUM CHLORIDE 0.9 % IV SOLN: INTRAVENOUS | Status: DC | PRN

## 2020-12-07 NOTE — Progress Notes (Signed)
Patient received IV Venofer 500mg  ( dose #1 of 2)  as ordered by MD. Tylenol and Benadryl PO given as premeds per orders. Tolerated well, vitals stable, discharge instructions given , verbalized understanding. Pt instructed to RTC in 2 weeks for next infusion and to schedule this appointment at front desk prior to leaving, verbalized understanding. Patient alert, oriented, and ambulatory at the time of discharge,accompanied by sister.

## 2020-12-12 ENCOUNTER — Other Ambulatory Visit: Payer: Self-pay | Admitting: *Deleted

## 2020-12-12 ENCOUNTER — Ambulatory Visit: Payer: Managed Care, Other (non HMO) | Admitting: *Deleted

## 2020-12-12 ENCOUNTER — Other Ambulatory Visit: Payer: Self-pay

## 2020-12-12 ENCOUNTER — Ambulatory Visit: Payer: Managed Care, Other (non HMO) | Attending: Obstetrics

## 2020-12-12 ENCOUNTER — Encounter: Payer: Self-pay | Admitting: *Deleted

## 2020-12-12 VITALS — BP 119/83 | HR 126

## 2020-12-12 DIAGNOSIS — O352XX0 Maternal care for (suspected) hereditary disease in fetus, not applicable or unspecified: Secondary | ICD-10-CM

## 2020-12-12 DIAGNOSIS — Z3A29 29 weeks gestation of pregnancy: Secondary | ICD-10-CM | POA: Diagnosis not present

## 2020-12-12 DIAGNOSIS — O30039 Twin pregnancy, monochorionic/diamniotic, unspecified trimester: Secondary | ICD-10-CM

## 2020-12-12 DIAGNOSIS — O30033 Twin pregnancy, monochorionic/diamniotic, third trimester: Secondary | ICD-10-CM | POA: Diagnosis not present

## 2020-12-19 ENCOUNTER — Ambulatory Visit
Admission: EM | Admit: 2020-12-19 | Discharge: 2020-12-19 | Disposition: A | Discharge status: Home / Self Care | Payer: Managed Care, Other (non HMO) | Attending: Urgent Care | Admitting: Urgent Care

## 2020-12-19 ENCOUNTER — Other Ambulatory Visit: Payer: Self-pay

## 2020-12-19 DIAGNOSIS — H5789 Other specified disorders of eye and adnexa: Secondary | ICD-10-CM

## 2020-12-19 DIAGNOSIS — R070 Pain in throat: Secondary | ICD-10-CM

## 2020-12-19 DIAGNOSIS — J069 Acute upper respiratory infection, unspecified: Secondary | ICD-10-CM | POA: Diagnosis not present

## 2020-12-19 DIAGNOSIS — J3489 Other specified disorders of nose and nasal sinuses: Secondary | ICD-10-CM

## 2020-12-19 DIAGNOSIS — H5713 Ocular pain, bilateral: Secondary | ICD-10-CM

## 2020-12-19 MED ORDER — TOBRAMYCIN 0.3 % OP SOLN: 1.0000 [drp] | OPHTHALMIC | 0 refills | Status: DC

## 2020-12-19 MED ORDER — IPRATROPIUM BROMIDE 0.03 % NA SOLN: 2.0000 | Freq: Two times a day (BID) | NASAL | 0 refills | Status: DC

## 2020-12-19 MED ORDER — OLOPATADINE HCL 0.1 % OP SOLN: 1.0000 [drp] | Freq: Two times a day (BID) | OPHTHALMIC | 0 refills | Status: DC

## 2020-12-19 MED ORDER — CETIRIZINE HCL 10 MG PO TABS: 10.0000 mg | ORAL_TABLET | Freq: Every day | ORAL | 0 refills | Status: DC

## 2020-12-19 NOTE — Discharge Instructions (Signed)
We will notify you of your test results as they arrive and may take between 48-72 hours.  I encourage you to sign up for MyChart if you have not already done so as this can be the easiest way for [place] to communicate results to you online or through a phone app.  Generally, we only contact you if it is a positive test result.  In the meantime, if you develop worsening symptoms including fever, chest pain, shortness of breath despite our current treatment plan then please report to the emergency room as this may be a sign of worsening status from possible viral infection.  Otherwise, we will manage this as a viral syndrome. For sore throat or cough try using a honey-based tea. Use 3 teaspoons of honey with juice squeezed from half lemon. Place shaved pieces of ginger into 1/2-1 cup of water and warm over stove top. Then mix the ingredients and repeat every 4 hours as needed. Please take Tylenol 500mg -650mg  every 6 hours for aches and pains, fevers. Hydrate very well with at least 2 liters of water. Eat light meals such as soups to replenish electrolytes and soft fruits, veggies. Start an antihistamine like Zyrtec for postnasal drainage, sinus congestion.  Use the nasal spray together with Zyrtec. For the eyes, we will be using olopatadine for allergic irritation and tobramycin for infection of the eyes like pink eye.

## 2020-12-19 NOTE — ED Triage Notes (Signed)
Pt reports bilateral crusty eyes x 2 days. OTC meds gives no relief.

## 2020-12-19 NOTE — ED Provider Notes (Signed)
[facility]   MRN: [id number] DOB: 07-12-1995  Subjective:   Jody Watson is a 25 y.o. female presenting for 2-day history of acute onset bilateral eye redness.  Symptoms started out with the left eye but progressed to the right.  She is now having persistent eye discomfort, had matting of her eyelashes bilaterally this morning.  No itching.  No vision changes.  Has also had a runny and stuffy nose, scratchy throat.  Did an at-home COVID test and was negative.  Patient is actually currently pregnant.  Estimated date of delivery is February 24, 2021.  No cough, chest pain, shortness of breath, wheezing, body aches.  No current facility-administered medications for this encounter.  Current Outpatient Medications:    aspirin EC 81 MG tablet, Take 81 mg by mouth daily. Swallow whole., Disp: , Rfl:    EPINEPHrine 0.3 mg/0.3 mL IJ SOAJ injection, Inject 0.3 mg into the muscle as needed for anaphylaxis. (Patient not taking: Reported on 11/14/2020), Disp: , Rfl:    Pediatric Multivit-Minerals-C (MULTIVITAMIN GUMMIES CHILDRENS PO), Take 2 tablets by mouth daily., Disp: , Rfl:    promethazine (PHENERGAN) 25 MG tablet, Take 25 mg by mouth every 6 (six) hours as needed for nausea or vomiting., Disp: , Rfl:    sertraline (ZOLOFT) 25 MG tablet, Take 25 mg by mouth daily., Disp: , Rfl:    Allergies  Allergen Reactions   Bee Venom Anaphylaxis    Past Medical History:  Diagnosis Date   Anemia    none recent   Anxiety    Asthma    no inhalers used   COVID jan 18th 2022   rapid home sob stomach pain n/v diarrhea x 2 weeks allsymptoms resoleved   Depression    Headache      Past Surgical History:  Procedure Laterality Date   DILATION AND EVACUATION N/A 03/11/2020   Procedure: DILATATION AND EVACUATION;  Surgeon: Shea Evans, MD;  Location: [facility];  Service: Gynecology;  Laterality: N/A;   OPERATIVE ULTRASOUND N/A 03/11/2020   Procedure: OPERATIVE  ULTRASOUND;  Surgeon: Shea Evans, MD;  Location: [facility];  Service: Gynecology;  Laterality: N/A;   TONSILLECTOMY  as child   wisdome teeth extraction  age 66    Family History  Problem Relation Age of Onset   Asthma Father    Diabetes Father    Alcohol abuse Maternal Grandfather    Diabetes Paternal Grandfather     Social History   Tobacco Use   Smoking status: Never   Smokeless tobacco: Never  Vaping Use   Vaping Use: Never used  Substance Use Topics   Alcohol use: No   Drug use: No    ROS   Objective:   Vitals: BP 131/85 (BP Location: Right Arm)    Pulse (!) 125    Temp 99.6 F (37.6 C) (Oral)    Resp 18    LMP 12/01/2019    SpO2 96%   Physical Exam Constitutional:      General: She is not in acute distress.    Appearance: Normal appearance. She is well-developed. She is not ill-appearing, toxic-appearing or diaphoretic.  HENT:     Head: Normocephalic and atraumatic.     Right Ear: Tympanic membrane, ear canal and external ear normal. No drainage or tenderness. No middle ear effusion. Tympanic membrane is not erythematous.     Left Ear: Tympanic membrane, ear canal and external ear normal. No drainage or tenderness.  No middle  ear effusion. Tympanic membrane is not erythematous.     Nose: Nose normal. No congestion or rhinorrhea.     Mouth/Throat:     Mouth: Mucous membranes are moist. No oral lesions.     Pharynx: No pharyngeal swelling, oropharyngeal exudate, posterior oropharyngeal erythema or uvula swelling.     Tonsils: No tonsillar exudate or tonsillar abscesses.  Eyes:     General: Lids are everted, no foreign bodies appreciated. No scleral icterus.       Right eye: Discharge present.        Left eye: Discharge present.    Extraocular Movements: Extraocular movements intact.     Right eye: Normal extraocular motion.     Left eye: Normal extraocular motion.     Conjunctiva/sclera:     Right eye: Right conjunctiva is injected. No  chemosis, exudate or hemorrhage.    Left eye: Left conjunctiva is injected. No chemosis, exudate or hemorrhage.    Pupils: Pupils are equal, round, and reactive to light.     Comments: Conjunctiva injected bilaterally.  Cardiovascular:     Rate and Rhythm: Normal rate.  Pulmonary:     Effort: Pulmonary effort is normal.  Musculoskeletal:     Cervical back: Normal range of motion and neck supple.  Lymphadenopathy:     Cervical: No cervical adenopathy.  Skin:    General: Skin is warm and dry.  Neurological:     General: No focal deficit present.     Mental Status: She is alert and oriented to person, place, and time.  Psychiatric:        Mood and Affect: Mood normal.        Behavior: Behavior normal.        Thought Content: Thought content normal.        Judgment: Judgment normal.    Assessment and Plan :   PDMP not reviewed this encounter.  1. Viral URI   2. Throat discomfort   3. Stuffy and runny nose   4. Redness of both eyes   5. Eye pain, bilateral    Start olopatadine and tobramycin combination therapy for the eyes to address bacterial and allergic conjunctivitis.  COVID and flu test pending.  We will otherwise manage for viral upper respiratory infection.  Physical exam findings reassuring and vital signs stable for discharge. Advised supportive care, offered symptomatic relief. Counseled patient on potential for adverse effects with medications prescribed/recommended today, ER and return-to-clinic precautions discussed, patient verbalized understanding.       Wallis Bamberg, [place] 12/19/20 [phone]

## 2020-12-20 LAB — COVID-19, FLU A+B NAA
Influenza A, NAA: NOT DETECTED
Influenza B, NAA: NOT DETECTED
SARS-CoV-2, NAA: NOT DETECTED

## 2020-12-21 ENCOUNTER — Encounter (HOSPITAL_COMMUNITY): Payer: Managed Care, Other (non HMO)

## 2020-12-27 ENCOUNTER — Other Ambulatory Visit: Payer: Self-pay

## 2020-12-27 ENCOUNTER — Other Ambulatory Visit: Payer: Self-pay | Admitting: *Deleted

## 2020-12-27 ENCOUNTER — Ambulatory Visit: Payer: Managed Care, Other (non HMO) | Admitting: *Deleted

## 2020-12-27 ENCOUNTER — Ambulatory Visit (HOSPITAL_BASED_OUTPATIENT_CLINIC_OR_DEPARTMENT_OTHER): Payer: Managed Care, Other (non HMO)

## 2020-12-27 VITALS — BP 131/90

## 2020-12-27 DIAGNOSIS — Z3A31 31 weeks gestation of pregnancy: Secondary | ICD-10-CM

## 2020-12-27 DIAGNOSIS — O30033 Twin pregnancy, monochorionic/diamniotic, third trimester: Secondary | ICD-10-CM | POA: Diagnosis not present

## 2020-12-27 DIAGNOSIS — O30039 Twin pregnancy, monochorionic/diamniotic, unspecified trimester: Secondary | ICD-10-CM

## 2020-12-27 DIAGNOSIS — O352XX0 Maternal care for (suspected) hereditary disease in fetus, not applicable or unspecified: Secondary | ICD-10-CM

## 2020-12-27 NOTE — Progress Notes (Signed)
Pt feels she lost a portion of mucous plug Friday night. She state it looked like thick mucous. No leaking or contractions.

## 2020-12-29 ENCOUNTER — Non-Acute Institutional Stay (HOSPITAL_COMMUNITY)
Admission: RE | Admit: 2020-12-29 | Discharge: 2020-12-29 | Disposition: A | Discharge status: Home / Self Care | Payer: Managed Care, Other (non HMO) | Source: Ambulatory Visit | Attending: Internal Medicine | Admitting: Internal Medicine

## 2020-12-29 ENCOUNTER — Inpatient Hospital Stay (HOSPITAL_COMMUNITY): Payer: Managed Care, Other (non HMO) | Admitting: Anesthesiology

## 2020-12-29 ENCOUNTER — Encounter (HOSPITAL_COMMUNITY)
Admission: AD | Disposition: A | Discharge status: Home / Self Care | Payer: Self-pay | Source: Home / Self Care | Attending: Obstetrics and Gynecology

## 2020-12-29 ENCOUNTER — Other Ambulatory Visit: Payer: Self-pay

## 2020-12-29 ENCOUNTER — Encounter (HOSPITAL_COMMUNITY): Payer: Self-pay | Admitting: Obstetrics and Gynecology

## 2020-12-29 ENCOUNTER — Inpatient Hospital Stay (HOSPITAL_COMMUNITY): Payer: Managed Care, Other (non HMO)

## 2020-12-29 ENCOUNTER — Inpatient Hospital Stay (HOSPITAL_COMMUNITY)
Admission: AD | Admit: 2020-12-29 | Discharge: 2021-01-01 | DRG: 786 | Disposition: A | Discharge status: Home / Self Care | Payer: Managed Care, Other (non HMO) | Attending: Obstetrics and Gynecology | Admitting: Obstetrics and Gynecology

## 2020-12-29 DIAGNOSIS — O4290 Premature rupture of membranes, unspecified as to length of time between rupture and onset of labor, unspecified weeks of gestation: Secondary | ICD-10-CM

## 2020-12-29 DIAGNOSIS — O42913 Preterm premature rupture of membranes, unspecified as to length of time between rupture and onset of labor, third trimester: Secondary | ICD-10-CM | POA: Diagnosis present

## 2020-12-29 DIAGNOSIS — O9902 Anemia complicating childbirth: Secondary | ICD-10-CM | POA: Diagnosis present

## 2020-12-29 DIAGNOSIS — O133 Gestational [pregnancy-induced] hypertension without significant proteinuria, third trimester: Secondary | ICD-10-CM

## 2020-12-29 DIAGNOSIS — Z3A31 31 weeks gestation of pregnancy: Secondary | ICD-10-CM | POA: Diagnosis not present

## 2020-12-29 DIAGNOSIS — Z20822 Contact with and (suspected) exposure to covid-19: Secondary | ICD-10-CM | POA: Diagnosis present

## 2020-12-29 DIAGNOSIS — Z23 Encounter for immunization: Secondary | ICD-10-CM | POA: Diagnosis not present

## 2020-12-29 DIAGNOSIS — D509 Iron deficiency anemia, unspecified: Secondary | ICD-10-CM | POA: Diagnosis present

## 2020-12-29 DIAGNOSIS — O42919 Preterm premature rupture of membranes, unspecified as to length of time between rupture and onset of labor, unspecified trimester: Secondary | ICD-10-CM | POA: Diagnosis not present

## 2020-12-29 DIAGNOSIS — O30033 Twin pregnancy, monochorionic/diamniotic, third trimester: Secondary | ICD-10-CM | POA: Diagnosis present

## 2020-12-29 DIAGNOSIS — R030 Elevated blood-pressure reading, without diagnosis of hypertension: Secondary | ICD-10-CM | POA: Diagnosis present

## 2020-12-29 DIAGNOSIS — O26893 Other specified pregnancy related conditions, third trimester: Secondary | ICD-10-CM | POA: Diagnosis present

## 2020-12-29 DIAGNOSIS — O321XX1 Maternal care for breech presentation, fetus 1: Secondary | ICD-10-CM | POA: Diagnosis present

## 2020-12-29 DIAGNOSIS — F419 Anxiety disorder, unspecified: Secondary | ICD-10-CM | POA: Diagnosis present

## 2020-12-29 DIAGNOSIS — O99344 Other mental disorders complicating childbirth: Secondary | ICD-10-CM | POA: Diagnosis present

## 2020-12-29 DIAGNOSIS — O99892 Other specified diseases and conditions complicating childbirth: Secondary | ICD-10-CM | POA: Diagnosis present

## 2020-12-29 DIAGNOSIS — Z2839 Other underimmunization status: Secondary | ICD-10-CM | POA: Diagnosis present

## 2020-12-29 HISTORY — DX: Polycystic ovarian syndrome: E28.2

## 2020-12-29 HISTORY — DX: Urinary tract infection, site not specified: N39.0

## 2020-12-29 LAB — COMPREHENSIVE METABOLIC PANEL
ALT: 15 U/L (ref 0–44)
AST: 15 U/L (ref 15–41)
Albumin: 2.7 g/dL — ABNORMAL LOW (ref 3.5–5.0)
Alkaline Phosphatase: 143 U/L — ABNORMAL HIGH (ref 38–126)
Anion gap: 8 (ref 5–15)
BUN: <5 mg/dL — ABNORMAL LOW (ref 6–20)
CO2: 23 mmol/L (ref 22–32)
Calcium: 9.2 mg/dL (ref 8.9–10.3)
Chloride: 107 mmol/L (ref 98–111)
Creatinine, Ser: 0.55 mg/dL (ref 0.44–1.00)
GFR, Estimated: >60 mL/min (ref >60)
Glucose, Bld: 88 mg/dL (ref 70–99)
Potassium: 4 mmol/L (ref 3.5–5.1)
Sodium: 138 mmol/L (ref 135–145)
Total Bilirubin: 0.6 mg/dL (ref 0.3–1.2)
Total Protein: 6 g/dL — ABNORMAL LOW (ref 6.5–8.1)

## 2020-12-29 LAB — CBC
HCT: 37.8 % (ref 36.0–46.0)
Hemoglobin: 11.4 g/dL — ABNORMAL LOW (ref 12.0–15.0)
MCH: 22.6 pg — ABNORMAL LOW (ref 26.0–34.0)
MCHC: 30.2 g/dL (ref 30.0–36.0)
MCV: 74.9 fL — ABNORMAL LOW (ref 80.0–100.0)
Platelets: 343 10*3/uL (ref 150–400)
RBC: 5.05 MIL/uL (ref 3.87–5.11)
RDW: 21.7 % — ABNORMAL HIGH (ref 11.5–15.5)
WBC: 13.1 10*3/uL — ABNORMAL HIGH (ref 4.0–10.5)
nRBC: 0.2 % (ref 0.0–0.2)

## 2020-12-29 LAB — PROTEIN / CREATININE RATIO, URINE
Creatinine, Urine: 52.82 mg/dL
Protein Creatinine Ratio: 0.64 mg/mg{Cre} — ABNORMAL HIGH (ref 0.00–0.15)
Total Protein, Urine: 34 mg/dL

## 2020-12-29 LAB — RESP PANEL BY RT-PCR (FLU A&B, COVID) ARPGX2
Influenza A by PCR: NEGATIVE
Influenza B by PCR: NEGATIVE
SARS Coronavirus 2 by RT PCR: NEGATIVE

## 2020-12-29 LAB — PREPARE RBC (CROSSMATCH)

## 2020-12-29 SURGERY — Surgical Case
Anesthesia: Spinal

## 2020-12-29 MED ORDER — SIMETHICONE 80 MG PO CHEW: 80.0000 mg | CHEWABLE_TABLET | ORAL | Status: DC | PRN

## 2020-12-29 MED ORDER — DOCUSATE SODIUM 100 MG PO CAPS: 100.0000 mg | ORAL_CAPSULE | Freq: Every day | ORAL | Status: DC

## 2020-12-29 MED ORDER — ONDANSETRON HCL 4 MG/2ML IJ SOLN: 4.0000 mg | Freq: Three times a day (TID) | INTRAMUSCULAR | Status: DC | PRN

## 2020-12-29 MED ORDER — ACETAMINOPHEN 325 MG PO TABS: 650.0000 mg | ORAL_TABLET | ORAL | Status: DC | PRN

## 2020-12-29 MED ORDER — DEXAMETHASONE SODIUM PHOSPHATE 10 MG/ML IJ SOLN
INTRAMUSCULAR | Status: AC
  Filled 2020-12-29: qty 1

## 2020-12-29 MED ORDER — ZOLPIDEM TARTRATE 5 MG PO TABS: 5.0000 mg | ORAL_TABLET | Freq: Every evening | ORAL | Status: DC | PRN

## 2020-12-29 MED ORDER — PHENYLEPHRINE 40 MCG/ML (10ML) SYRINGE FOR IV PUSH (FOR BLOOD PRESSURE SUPPORT)
PREFILLED_SYRINGE | INTRAVENOUS | Status: DC | PRN
  Administered 2020-12-29 (×2): 80 ug via INTRAVENOUS
  Administered 2020-12-29 (×2): 120 ug via INTRAVENOUS

## 2020-12-29 MED ORDER — ACETAMINOPHEN 10 MG/ML IV SOLN
INTRAVENOUS | Status: DC | PRN
  Administered 2020-12-29: 1000 mg via INTRAVENOUS

## 2020-12-29 MED ORDER — KETOROLAC TROMETHAMINE 30 MG/ML IJ SOLN
INTRAMUSCULAR | Status: AC
  Filled 2020-12-29: qty 1

## 2020-12-29 MED ORDER — ACETAMINOPHEN 500 MG PO TABS
500.0000 mg | ORAL_TABLET | Freq: Once | ORAL | Status: AC
  Administered 2020-12-29: 10:00:00 500 mg via ORAL
  Filled 2020-12-29: qty 1

## 2020-12-29 MED ORDER — SODIUM CHLORIDE 0.9 % IV SOLN: INTRAVENOUS | Status: DC | PRN

## 2020-12-29 MED ORDER — KETOROLAC TROMETHAMINE 30 MG/ML IJ SOLN: 30.0000 mg | Freq: Four times a day (QID) | INTRAMUSCULAR | Status: AC | PRN

## 2020-12-29 MED ORDER — PHENYLEPHRINE 40 MCG/ML (10ML) SYRINGE FOR IV PUSH (FOR BLOOD PRESSURE SUPPORT)
PREFILLED_SYRINGE | INTRAVENOUS | Status: AC
  Filled 2020-12-29: qty 10

## 2020-12-29 MED ORDER — COCONUT OIL OIL
1.0000 "application " | TOPICAL_OIL | Status: DC | PRN
  Administered 2020-12-31: 1 via TOPICAL

## 2020-12-29 MED ORDER — MORPHINE SULFATE (PF) 0.5 MG/ML IJ SOLN: INTRAMUSCULAR | Status: DC | PRN

## 2020-12-29 MED ORDER — OXYCODONE HCL 5 MG PO TABS
5.0000 mg | ORAL_TABLET | ORAL | Status: DC | PRN
  Administered 2020-12-30: 22:00:00 5 mg via ORAL
  Administered 2020-12-31 (×4): 10 mg via ORAL
  Administered 2021-01-01: 5 mg via ORAL
  Filled 2020-12-29 (×2): qty 2
  Filled 2020-12-29: qty 1
  Filled 2020-12-29: qty 2
  Filled 2020-12-29: qty 1
  Filled 2020-12-29: qty 2

## 2020-12-29 MED ORDER — WITCH HAZEL-GLYCERIN EX PADS: 1.0000 "application " | MEDICATED_PAD | CUTANEOUS | Status: DC | PRN

## 2020-12-29 MED ORDER — DIPHENHYDRAMINE HCL 25 MG PO CAPS: 25.0000 mg | ORAL_CAPSULE | ORAL | Status: DC | PRN

## 2020-12-29 MED ORDER — HYDROMORPHONE HCL 1 MG/ML IJ SOLN: 0.2000 mg | INTRAMUSCULAR | Status: DC | PRN

## 2020-12-29 MED ORDER — KETOROLAC TROMETHAMINE 30 MG/ML IJ SOLN
30.0000 mg | Freq: Four times a day (QID) | INTRAMUSCULAR | Status: AC
  Administered 2020-12-30 (×3): 30 mg via INTRAVENOUS
  Filled 2020-12-29 (×3): qty 1

## 2020-12-29 MED ORDER — SCOPOLAMINE 1 MG/3DAYS TD PT72
MEDICATED_PATCH | TRANSDERMAL | Status: AC
  Filled 2020-12-29: qty 1

## 2020-12-29 MED ORDER — OXYTOCIN-SODIUM CHLORIDE 30-0.9 UT/500ML-% IV SOLN
INTRAVENOUS | Status: AC
  Filled 2020-12-29: qty 500

## 2020-12-29 MED ORDER — BUPIVACAINE IN DEXTROSE 0.75-8.25 % IT SOLN
INTRATHECAL | Status: DC | PRN
  Administered 2020-12-29: 1.6 mL via INTRATHECAL

## 2020-12-29 MED ORDER — DIPHENHYDRAMINE HCL 50 MG/ML IJ SOLN: 12.5000 mg | INTRAMUSCULAR | Status: DC | PRN

## 2020-12-29 MED ORDER — LACTATED RINGERS IV SOLN: INTRAVENOUS | Status: DC

## 2020-12-29 MED ORDER — CEFAZOLIN SODIUM-DEXTROSE 2-4 GM/100ML-% IV SOLN
2.0000 g | INTRAVENOUS | Status: AC
  Administered 2020-12-29: 18:00:00 2 g via INTRAVENOUS
  Filled 2020-12-29: qty 100

## 2020-12-29 MED ORDER — FAMOTIDINE IN NACL 20-0.9 MG/50ML-% IV SOLN
20.0000 mg | Freq: Once | INTRAVENOUS | Status: AC
  Administered 2020-12-29: 17:00:00 20 mg via INTRAVENOUS
  Filled 2020-12-29: qty 50

## 2020-12-29 MED ORDER — PRENATAL MULTIVITAMIN CH: 1.0000 | ORAL_TABLET | Freq: Every day | ORAL | Status: DC

## 2020-12-29 MED ORDER — ZOLPIDEM TARTRATE 5 MG PO TABS
5.0000 mg | ORAL_TABLET | Freq: Every evening | ORAL | Status: DC | PRN
  Filled 2020-12-29: qty 1

## 2020-12-29 MED ORDER — DEXAMETHASONE SODIUM PHOSPHATE 10 MG/ML IJ SOLN
INTRAMUSCULAR | Status: DC | PRN
  Administered 2020-12-29: 10 mg via INTRAVENOUS

## 2020-12-29 MED ORDER — DIBUCAINE (PERIANAL) 1 % EX OINT: 1.0000 "application " | TOPICAL_OINTMENT | CUTANEOUS | Status: DC | PRN

## 2020-12-29 MED ORDER — BUSPIRONE HCL 5 MG PO TABS
5.0000 mg | ORAL_TABLET | Freq: Every day | ORAL | Status: DC
  Administered 2020-12-29 – 2020-12-31 (×3): 5 mg via ORAL
  Filled 2020-12-29 (×5): qty 1

## 2020-12-29 MED ORDER — SENNOSIDES-DOCUSATE SODIUM 8.6-50 MG PO TABS
2.0000 | ORAL_TABLET | Freq: Every day | ORAL | Status: DC
  Administered 2020-12-30 – 2021-01-01 (×3): 2 via ORAL
  Filled 2020-12-29 (×3): qty 2

## 2020-12-29 MED ORDER — TETANUS-DIPHTH-ACELL PERTUSSIS 5-2.5-18.5 LF-MCG/0.5 IM SUSY: 0.5000 mL | PREFILLED_SYRINGE | Freq: Once | INTRAMUSCULAR | Status: DC

## 2020-12-29 MED ORDER — AMOXICILLIN 500 MG PO CAPS: 500.0000 mg | ORAL_CAPSULE | Freq: Three times a day (TID) | ORAL | Status: DC

## 2020-12-29 MED ORDER — SIMETHICONE 80 MG PO CHEW
80.0000 mg | CHEWABLE_TABLET | Freq: Three times a day (TID) | ORAL | Status: DC
  Administered 2020-12-30 – 2021-01-01 (×7): 80 mg via ORAL
  Filled 2020-12-29 (×7): qty 1

## 2020-12-29 MED ORDER — PROPOFOL 10 MG/ML IV BOLUS
INTRAVENOUS | Status: AC
  Filled 2020-12-29: qty 20

## 2020-12-29 MED ORDER — ONDANSETRON HCL 4 MG/2ML IJ SOLN
INTRAMUSCULAR | Status: AC
  Filled 2020-12-29: qty 2

## 2020-12-29 MED ORDER — BETAMETHASONE SOD PHOS & ACET 6 (3-3) MG/ML IJ SUSP
12.0000 mg | INTRAMUSCULAR | Status: DC
  Administered 2020-12-29: 16:00:00 12 mg via INTRAMUSCULAR
  Filled 2020-12-29: qty 5

## 2020-12-29 MED ORDER — ONDANSETRON HCL 4 MG/2ML IJ SOLN
INTRAMUSCULAR | Status: DC | PRN
  Administered 2020-12-29: 4 mg via INTRAVENOUS

## 2020-12-29 MED ORDER — OXYTOCIN-SODIUM CHLORIDE 30-0.9 UT/500ML-% IV SOLN
INTRAVENOUS | Status: DC | PRN
  Administered 2020-12-29: 300 mL via INTRAVENOUS

## 2020-12-29 MED ORDER — NALOXONE HCL 4 MG/10ML IJ SOLN
1.0000 ug/kg/h | INTRAVENOUS | Status: DC | PRN
  Filled 2020-12-29: qty 5

## 2020-12-29 MED ORDER — FENTANYL CITRATE (PF) 100 MCG/2ML IJ SOLN
INTRAMUSCULAR | Status: DC | PRN
  Administered 2020-12-29: 15 ug via INTRATHECAL

## 2020-12-29 MED ORDER — IBUPROFEN 600 MG PO TABS
600.0000 mg | ORAL_TABLET | Freq: Four times a day (QID) | ORAL | Status: DC
  Administered 2020-12-30 – 2021-01-01 (×6): 600 mg via ORAL
  Filled 2020-12-29 (×7): qty 1

## 2020-12-29 MED ORDER — MAGNESIUM SULFATE BOLUS VIA INFUSION
4.0000 g | Freq: Once | INTRAVENOUS | Status: AC
  Administered 2020-12-29: 16:00:00 4 g via INTRAVENOUS
  Filled 2020-12-29: qty 1000

## 2020-12-29 MED ORDER — MORPHINE SULFATE (PF) 0.5 MG/ML IJ SOLN
INTRAMUSCULAR | Status: AC
  Filled 2020-12-29: qty 10

## 2020-12-29 MED ORDER — DIPHENHYDRAMINE HCL 25 MG PO CAPS: 25.0000 mg | ORAL_CAPSULE | Freq: Four times a day (QID) | ORAL | Status: DC | PRN

## 2020-12-29 MED ORDER — AZITHROMYCIN 250 MG PO TABS
1000.0000 mg | ORAL_TABLET | Freq: Once | ORAL | Status: AC
  Administered 2020-12-29: 16:00:00 1000 mg via ORAL
  Filled 2020-12-29: qty 4

## 2020-12-29 MED ORDER — SOD CITRATE-CITRIC ACID 500-334 MG/5ML PO SOLN
30.0000 mL | Freq: Once | ORAL | Status: AC
  Administered 2020-12-29: 17:00:00 30 mL via ORAL
  Filled 2020-12-29: qty 30

## 2020-12-29 MED ORDER — SCOPOLAMINE 1 MG/3DAYS TD PT72
1.0000 | MEDICATED_PATCH | Freq: Once | TRANSDERMAL | Status: AC
  Administered 2020-12-29: 19:00:00 1.5 mg via TRANSDERMAL

## 2020-12-29 MED ORDER — NALOXONE HCL 0.4 MG/ML IJ SOLN: 0.4000 mg | INTRAMUSCULAR | Status: DC | PRN

## 2020-12-29 MED ORDER — SERTRALINE HCL 50 MG PO TABS
25.0000 mg | ORAL_TABLET | Freq: Every day | ORAL | Status: DC
  Administered 2020-12-29 – 2020-12-31 (×3): 25 mg via ORAL
  Filled 2020-12-29 (×3): qty 1

## 2020-12-29 MED ORDER — MENTHOL 3 MG MT LOZG: 1.0000 | LOZENGE | OROMUCOSAL | Status: DC | PRN

## 2020-12-29 MED ORDER — KETOROLAC TROMETHAMINE 30 MG/ML IJ SOLN
30.0000 mg | Freq: Four times a day (QID) | INTRAMUSCULAR | Status: AC | PRN
  Administered 2020-12-29: 20:00:00 30 mg via INTRAMUSCULAR

## 2020-12-29 MED ORDER — MORPHINE SULFATE (PF) 0.5 MG/ML IJ SOLN
INTRAMUSCULAR | Status: DC | PRN
  Administered 2020-12-29: .15 mg via INTRATHECAL

## 2020-12-29 MED ORDER — OXYTOCIN-SODIUM CHLORIDE 30-0.9 UT/500ML-% IV SOLN: 2.5000 [IU]/h | INTRAVENOUS | Status: AC

## 2020-12-29 MED ORDER — CALCIUM CARBONATE ANTACID 500 MG PO CHEW: 2.0000 | CHEWABLE_TABLET | ORAL | Status: DC | PRN

## 2020-12-29 MED ORDER — PRENATAL MULTIVITAMIN CH
1.0000 | ORAL_TABLET | Freq: Every day | ORAL | Status: DC
  Administered 2020-12-30 – 2020-12-31 (×2): 1 via ORAL
  Filled 2020-12-29 (×3): qty 1

## 2020-12-29 MED ORDER — MAGNESIUM SULFATE 40 GM/1000ML IV SOLN
2.0000 g/h | INTRAVENOUS | Status: DC
  Administered 2020-12-29: 16:00:00 2 g/h via INTRAVENOUS
  Filled 2020-12-29: qty 1000

## 2020-12-29 MED ORDER — SODIUM CHLORIDE 0.9% FLUSH: 3.0000 mL | INTRAVENOUS | Status: DC | PRN

## 2020-12-29 MED ORDER — FENTANYL CITRATE (PF) 100 MCG/2ML IJ SOLN
INTRAMUSCULAR | Status: AC
  Filled 2020-12-29: qty 2

## 2020-12-29 MED ORDER — DIPHENHYDRAMINE HCL 25 MG PO CAPS
25.0000 mg | ORAL_CAPSULE | Freq: Once | ORAL | Status: AC
  Administered 2020-12-29: 10:00:00 25 mg via ORAL
  Filled 2020-12-29: qty 1

## 2020-12-29 MED ORDER — PHENYLEPHRINE HCL-NACL 20-0.9 MG/250ML-% IV SOLN
INTRAVENOUS | Status: DC | PRN
Start: 2020-12-29 — End: 2020-12-29
  Administered 2020-12-29: 60 ug/min via INTRAVENOUS

## 2020-12-29 MED ORDER — ACETAMINOPHEN 500 MG PO TABS
1000.0000 mg | ORAL_TABLET | Freq: Four times a day (QID) | ORAL | Status: DC
  Administered 2020-12-30 – 2021-01-01 (×9): 1000 mg via ORAL
  Filled 2020-12-29 (×9): qty 2

## 2020-12-29 MED ORDER — SODIUM CHLORIDE 0.9 % IV SOLN
500.0000 mg | Freq: Once | INTRAVENOUS | Status: AC
  Administered 2020-12-29: 11:00:00 500 mg via INTRAVENOUS
  Filled 2020-12-29: qty 25

## 2020-12-29 MED ORDER — SODIUM CHLORIDE 0.9 % IV SOLN: 2.0000 g | Freq: Four times a day (QID) | INTRAVENOUS | Status: DC

## 2020-12-29 SURGICAL SUPPLY — 36 items
BENZOIN TINCTURE PRP APPL 2/3 (GAUZE/BANDAGES/DRESSINGS) ×1 IMPLANT
CHLORAPREP W/TINT 26ML (MISCELLANEOUS) ×2 IMPLANT
CLAMP CORD UMBIL (MISCELLANEOUS) IMPLANT
CLOSURE STERI STRIP 1/2 X4 (GAUZE/BANDAGES/DRESSINGS) ×1 IMPLANT
CLOTH BEACON ORANGE TIMEOUT ST (SAFETY) ×2 IMPLANT
DRSG OPSITE POSTOP 4X10 (GAUZE/BANDAGES/DRESSINGS) ×2 IMPLANT
ELECT REM PT RETURN 9FT ADLT (ELECTROSURGICAL) ×2
ELECTRODE REM PT RTRN 9FT ADLT (ELECTROSURGICAL) ×1 IMPLANT
EXTRACTOR VACUUM KIWI (MISCELLANEOUS) IMPLANT
EXTRACTOR VACUUM M CUP 4 TUBE (SUCTIONS) IMPLANT
GLOVE SURG ENC MOIS LTX SZ7 (GLOVE) ×2 IMPLANT
GLOVE SURG UNDER POLY LF SZ7 (GLOVE) ×2 IMPLANT
GOWN STRL REUS W/TWL LRG LVL3 (GOWN DISPOSABLE) ×4 IMPLANT
HOVERMATT SINGLE USE (MISCELLANEOUS) ×1 IMPLANT
KIT ABG SYR 3ML LUER SLIP (SYRINGE) IMPLANT
NDL HYPO 25X5/8 SAFETYGLIDE (NEEDLE) IMPLANT
NEEDLE HYPO 25X5/8 SAFETYGLIDE (NEEDLE) IMPLANT
NS IRRIG 1000ML POUR BTL (IV SOLUTION) ×2 IMPLANT
PACK C SECTION WH (CUSTOM PROCEDURE TRAY) ×2 IMPLANT
PAD OB MATERNITY 4.3X12.25 (PERSONAL CARE ITEMS) ×2 IMPLANT
RTRCTR C-SECT PINK 25CM LRG (MISCELLANEOUS) IMPLANT
STRIP CLOSURE SKIN 1/2X4 (GAUZE/BANDAGES/DRESSINGS) IMPLANT
SUT MNCRL 0 VIOLET CTX 36 (SUTURE) ×2 IMPLANT
SUT MONOCRYL 0 CTX 36 (SUTURE) ×2
SUT PLAIN 0 NONE (SUTURE) IMPLANT
SUT PLAIN 2 0 (SUTURE)
SUT PLAIN ABS 2-0 CT1 27XMFL (SUTURE) IMPLANT
SUT VIC AB 0 CT1 27 (SUTURE) ×2
SUT VIC AB 0 CT1 27XBRD ANBCTR (SUTURE) ×2 IMPLANT
SUT VIC AB 2-0 CT1 27 (SUTURE) ×1
SUT VIC AB 2-0 CT1 TAPERPNT 27 (SUTURE) ×1 IMPLANT
SUT VIC AB 4-0 KS 27 (SUTURE) ×2 IMPLANT
SUT VICRYL 0 TIES 12 18 (SUTURE) IMPLANT
TOWEL OR 17X24 6PK STRL BLUE (TOWEL DISPOSABLE) ×2 IMPLANT
TRAY FOLEY W/BAG SLVR 14FR LF (SET/KITS/TRAYS/PACK) IMPLANT
WATER STERILE IRR 1000ML POUR (IV SOLUTION) ×2 IMPLANT

## 2020-12-29 NOTE — MAU Note (Signed)
Pt was at MDs office getting iron infusion and her water broke around 1230pm. Clear fluid out. Reports some mild cramping.Good fetal movement reported.

## 2020-12-29 NOTE — Anesthesia Preprocedure Evaluation (Addendum)
Anesthesia Evaluation  Patient identified by MRN, date of birth, ID band Patient awake    Reviewed: Allergy & Precautions, NPO status , Patient's Chart, lab work & pertinent test results  Airway Mallampati: I  TM Distance: >3 FB     Dental no notable dental hx.    Pulmonary asthma ,    Pulmonary exam normal        Cardiovascular negative cardio ROS Normal cardiovascular exam     Neuro/Psych  Headaches, PSYCHIATRIC DISORDERS Anxiety Depression    GI/Hepatic negative GI ROS, Neg liver ROS,   Endo/Other  negative endocrine ROS  Renal/GU negative Renal ROS     Musculoskeletal   Abdominal   Peds  Hematology   Anesthesia Other Findings   Reproductive/Obstetrics (+) Pregnancy                            Anesthesia Physical Anesthesia Plan  ASA: 2 and emergent  Anesthesia Plan: Spinal   Post-op Pain Management:    Induction:   PONV Risk Score and Plan: 0 and Ondansetron  Airway Management Planned: Natural Airway  Additional Equipment: None  Intra-op Plan:   Post-operative Plan:   Informed Consent: I have reviewed the patients History and Physical, chart, labs and discussed the procedure including the risks, benefits and alternatives for the proposed anesthesia with the patient or authorized representative who has indicated his/her understanding and acceptance.       Plan Discussed with: CRNA  Anesthesia Plan Comments: (Lab Results      Component                Value               Date                      WBC                      13.1 (H)            12/29/2020                HGB                      11.4 (L)            12/29/2020                HCT                      37.8                12/29/2020                MCV                      74.9 (L)            12/29/2020                PLT                      343                 12/29/2020           )       Anesthesia  Quick Evaluation

## 2020-12-29 NOTE — H&P (Signed)
Jody Watson is a 25 y.o. female G2P0010 at [redacted]w[redacted]d with MCDA twin pregnancy presenting for LOF. Patient was receiving outpatient IV iron infusion earlier today when she felt a gush of fluid, around 1230.  This is a spontaneous MCDA twin pregnancy dated by 5w [place] not c/w LMP. Early pregnancy c/b recurrent first trimester VB and [facility]. She had normal NT scan x2 at 12 weeks and low risk Ultrascreen x2. Prenatal labs significant for varicella and rubella non-immune. AFP screen negative x2. She has been closely followed with serial [place] monitoring starting in 2nd trimester for TTTS screening which remained reassuring. She had normal detailed anatomy scan x2 with MFM and normal fetal echo x2 with [facility]. Most recent growth US performed with MFM on 12/27 which showed Baby A breech EFW 4#2 (52%) and Baby B cephalic 4#6 (69%) with normal fluid x2 and a calculated growth discordance of 6%. No hx of pre-existing HTN but has had intermittent elevated BP noted in the office since 21 weeks. Has been on prophylactic ASA 81mg  daily. Diabetes screen was negative and third trimester CBC significant for IDA. She received first IV iron transfusion on 12/7 and second today.  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  PMH significant for anxiety, well controlled on Zoloft and Buspirone  PSH D&E and tonsillectomy   Upon initial presentation to MAU patient was comfortable, reporting just mild cramping. She was started on latency antibiotic protocol, received the first dose of betamethasone steroids for fetal lung maturity, and magnesium for neuroprotection initiated. Since then she has progressively gotten more uncomfortable and is breathing through contractions every 5 minutes.   Husband at bedside.  OB History     Gravida  2   Para      Term      Preterm      AB  1   Living  0      SAB      IAB  1   Ectopic      Multiple      Live Births              Past Medical History:  Diagnosis Date   Anemia    none recent   Anxiety    Asthma    no inhalers used, denies current problem   COVID jan 18th 2022   rapid home sob stomach pain n/v diarrhea x 2 weeks allsymptoms resoleved   Depression    Headache    PCOS (polycystic ovarian syndrome)    UTI (urinary tract infection)    Past Surgical History:  Procedure Laterality Date   DILATION AND EVACUATION N/A 03/11/2020   Procedure: DILATATION AND EVACUATION;  Surgeon: 05/11/2020, MD;  Location: [facility];  Service: Gynecology;  Laterality: N/A;   OPERATIVE ULTRASOUND N/A 03/11/2020   Procedure: OPERATIVE ULTRASOUND;  Surgeon: 05/11/2020, MD;  Location: [facility];  Service: Gynecology;  Laterality: N/A;   TONSILLECTOMY  as child   wisdome teeth extraction  age 43   Family History: family history includes Alcohol abuse in her maternal grandfather; Asthma in her father; Diabetes in her father and paternal grandfather; Fibromyalgia in her mother. Social History:  reports that she has never smoked. She has never used smokeless tobacco. She reports that she does not currently use alcohol. She reports that she does not use drugs.     Maternal Diabetes: No Genetic Screening: Normal Maternal Ultrasounds/Referrals: Normal Fetal Ultrasounds  or other Referrals:  Fetal echo, Referred to Materal Fetal Medicine  Maternal Substance Abuse:  No Significant Maternal Medications:  Meds include: Zoloft Other: Buspirone  Significant Maternal Lab Results:  None Other Comments:  None  Review of Systems  Gastrointestinal:  Positive for abdominal pain.  All other systems reviewed and are negative. Per HPI Maternal Exam:  Uterine Assessment: Contraction strength is firm.  Contraction frequency is regular.  Abdomen: Fetal presentation: breech Introitus: Normal vulva. Normal vagina.  Ferning test: positive.  Nitrazine test: positive. Amniotic fluid character:  clear. Cervix: Cervix evaluated by sterile speculum exam and digital exam.     Fetal Exam Fetal State Assessment: Category I - tracings are normal.  Physical Exam Vitals reviewed.  Constitutional:      Appearance: She is obese.  HENT:     Head: Normocephalic.  Cardiovascular:     Rate and Rhythm: Tachycardia present.  Pulmonary:     Effort: Pulmonary effort is normal.  Genitourinary:    General: Normal vulva.  Musculoskeletal:     Cervical back: Normal range of motion.  Skin:    General: Skin is warm and dry.  Neurological:     General: No focal deficit present.     Mental Status: She is oriented to person, place, and time.  Psychiatric:        Mood and Affect: Mood normal.        Behavior: Behavior normal.    Cervix: 3/90/-2, copious clear fluid   Blood pressure 135/85, pulse (!) 102, temperature 98.4 F (36.9 C), temperature source Oral, resp. rate 20, last menstrual period 12/01/2019, SpO2 99 %, unknown if currently breastfeeding.  Prenatal labs: ABO, Rh:  --/--/O POS Performed at [facility], [address], [place], [place] [address]  [phone]) Antibody: NEG (03/11 0610) Rubella:  NON-immune RPR:   NR HBsAg:   NEG 08/12/20 HIV:   NR GBS:   N/A  Chemistry Recent Labs  Lab 12/29/20 1440  NA 138  K 4.0  CL 107  CO2 23  GLUCOSE 88  BUN <5*  CREATININE 0.55  CALCIUM 9.2  GFRNONAA >60  ANIONGAP 8    Recent Labs  Lab 12/29/20 1440  PROT 6.0*  ALBUMIN 2.7*  AST 15  ALT 15  ALKPHOS 143*  BILITOT 0.6   Hematology Recent Labs  Lab 12/29/20 1440  WBC 13.1*  RBC 5.05  HGB 11.4*  HCT 37.8  MCV 74.9*  MCH 22.6*  MCHC 30.2  RDW 21.7*  PLT 343   Cardiac EnzymesNo results for input(s): TROPONINI in the last 168 hours. No results for input(s): TROPIPOC in the last 168 hours.  BNPNo results for input(s): BNP, PROBNP in the last 168 hours.  DDimer No results for input(s): DDIMER in the last 168  hours.   Assessment/Plan: Jody Watson is a 25 y.o. female G2P0010 [redacted]w[redacted]d admitted with MCDA twin pregnancy complicated by PPROM today 12/29/20 and now in active labor  Given cervical dilation of 3 cm and 90% effacement with regular painful contractions and ROM, recommend proceeding with delivery. Given Breech presentation of Baby A, recommend proceeding with primary cesarean section, and patient and husband agree. Patient consented at the bedside for cesarean section including review of risks for bleeding, infection, damage to surrounding organs, risk to future pregnancies, and anesthesia risks. Patient is agreeable to blood transfusion if indicated. All questions answered to her satisfaction and consents signed.   -OR team notified. Spoke with Anesthesia, Dr. Hart Rochester regarding urgent  nature of cesarean section necessitating need to proceed before the 8hr NPO interval. Patient last ate at 1145 AM today -NICU team notified of plan for preterm delivery  -S/p 1st dose BMZ 1536 -S/p Magnesium 4g bolus at 1549, currently running maintenance 2g/hr- will discontinue once in OR -S/p Zithromax 1,000 at 1619 -Will give Ancef abx ppc -SCD VTE ppx -Routine preop/intraop care -IDA and PPH score of 8, stable Hgb on admission of 11.4, 2U cross match on hold ordered -Anxiety- cont home Zoloft and Buspirone PP -RH POS, Rubella and Varicella non-Imm, offer PP vaccinations -Plan for [facility] inpatient admission  Jody Watson A Tomio Kirk 12/29/2020, 4:08 PM

## 2020-12-29 NOTE — MAU Provider Note (Signed)
History     [id number]  Arrival date and time: 12/29/20 1410    Chief Complaint  Patient presents with   Rupture of Membranes     HPI Airis Barbee is a 25 y.o. at [redacted]w[redacted]d by 1st trimester [place] with hx notable for mono-di twin gestation this pregnancy, who presents for possible rupture of membranes.   Review of outside prenatal records from [facility]N Office (in media tab): mono-di gestation, mild range BP's at both 25 and 28 wk visits   Patient was at infusion center earlier today to get Venofer infusion While there she reported a large gush of fluid and was recommended to present to MAU for evaluation In addition patient had mild range BP during triage assessment at infusion center  Patient reports to me she was sitting at infusion center getting IV iron when her water broke Clear fluid, large amount Reports ongoing leaking No vaginal bleeding Endorses good fetal movement of both twins Not feeling any contractions   --/--/O POS Performed at [facility], [address], [place], [place] [address]  [phone])  OB History     Gravida  2   Para      Term      Preterm      AB  1   Living  0      SAB      IAB  1   Ectopic      Multiple      Live Births              Past Medical History:  Diagnosis Date   Anemia    none recent   Anxiety    Asthma    no inhalers used, denies current problem   COVID jan 18th 2022   rapid home sob stomach pain n/v diarrhea x 2 weeks allsymptoms resoleved   Depression    Headache    PCOS (polycystic ovarian syndrome)    UTI (urinary tract infection)     Past Surgical History:  Procedure Laterality Date   DILATION AND EVACUATION N/A 03/11/2020   Procedure: DILATATION AND EVACUATION;  Surgeon: Shea Evans, MD;  Location: [facility];  Service: Gynecology;  Laterality: N/A;   OPERATIVE ULTRASOUND N/A 03/11/2020   Procedure: OPERATIVE ULTRASOUND;  Surgeon: Shea Evans, MD;  Location: [facility];  Service: Gynecology;  Laterality: N/A;   TONSILLECTOMY  as child   wisdome teeth extraction  age 67    Family History  Problem Relation Age of Onset   Fibromyalgia Mother    Asthma Father    Diabetes Father    Alcohol abuse Maternal Grandfather    Diabetes Paternal Grandfather     Social History   Socioeconomic History   Marital status: Married    Spouse name: Not on file   Number of children: Not on file   Years of education: Not on file   Highest education level: Not on file  Occupational History   Not on file  Tobacco Use   Smoking status: Never   Smokeless tobacco: Never  Vaping Use   Vaping Use: Never used  Substance and Sexual Activity   Alcohol use: Not Currently   Drug use: No   Sexual activity: Yes    Comment: Nuvaring  Other Topics Concern   Not on file  Social History Narrative   Not on file   Social Determinants of Health   Financial Resource Strain: Not on file  Food  Insecurity: Not on file  Transportation Needs: Not on file  Physical Activity: Not on file  Stress: Not on file  Social Connections: Not on file  Intimate Partner Violence: Not on file    Allergies  Allergen Reactions   Bee Venom Anaphylaxis    Current Facility-Administered Medications on File Prior to Encounter  Medication Dose Route Frequency Provider Last Rate Last Admin   0.9 %  sodium chloride infusion   Intravenous PRN Shea Evans, MD 10 mL/hr at 12/29/20 1013 New Bag at 12/29/20 1013   Current Outpatient Medications on File Prior to Encounter  Medication Sig Dispense Refill   aspirin EC 81 MG tablet Take 81 mg by mouth daily. Swallow whole.     busPIRone (BUSPAR) 5 MG tablet Take 5 mg by mouth daily.     cetirizine (ZYRTEC ALLERGY) 10 MG tablet Take 1 tablet (10 mg total) by mouth daily. 30 tablet 0   ipratropium (ATROVENT) 0.03 % nasal spray Place 2 sprays into both nostrils 2 (two) times daily. 30 mL 0    Pediatric Multivit-Minerals-C (MULTIVITAMIN GUMMIES CHILDRENS PO) Take 2 tablets by mouth daily.     promethazine (PHENERGAN) 25 MG tablet Take 25 mg by mouth every 6 (six) hours as needed for nausea or vomiting.     sertraline (ZOLOFT) 25 MG tablet Take 25 mg by mouth daily.     EPINEPHrine 0.3 mg/0.3 mL IJ SOAJ injection Inject 0.3 mg into the muscle as needed for anaphylaxis. (Patient not taking: Reported on 11/14/2020)     tobramycin (TOBREX) 0.3 % ophthalmic solution Place 1 drop into both eyes every 4 (four) hours. 5 mL 0     ROS Pertinent positives and negative per HPI, all others reviewed and negative  Physical Exam   BP (!) 134/91    Pulse (!) 106    Temp 98.4 F (36.9 C) (Oral)    Resp 20    LMP 12/01/2019    SpO2 99%   Patient Vitals for the past 24 hrs:  BP Temp Temp src Pulse Resp SpO2  12/29/20 1515 (!) 134/91 98.4 F (36.9 C) Oral (!) 106 20 99 %  12/29/20 1500 (!) 133/93 -- -- (!) 111 -- 99 %  12/29/20 1451 (!) 135/91 -- -- (!) 114 -- --  12/29/20 1426 138/89 98.9 F (37.2 C) -- (!) 125 18 --    Physical Exam Vitals reviewed.  Constitutional:      General: She is not in acute distress.    Appearance: She is well-developed. She is not diaphoretic.  Eyes:     General: No scleral icterus. Pulmonary:     Effort: Pulmonary effort is normal. No respiratory distress.  Abdominal:     General: There is no distension.     Palpations: Abdomen is soft.     Tenderness: There is no abdominal tenderness. There is no guarding or rebound.  Skin:    General: Skin is warm and dry.  Neurological:     Mental Status: She is alert.     Coordination: Coordination normal.     Cervical Exam    Bedside Ultrasound Pt informed that the ultrasound is considered a limited OB ultrasound and is not intended to be a complete ultrasound exam.  Patient also informed that the ultrasound is not being completed with the intent of assessing for fetal or placental anomalies or any pelvic  abnormalities.  Explained that the purpose of todays ultrasound is to assess for  presentation and viability.  Patient acknowledges the purpose of the exam and the limitations of the study.    My interpretation: viable twin IUP with normal cardiac activity. Twin A breech, twin B cephalic  FHT Twin A Baseline 468, moderate variability, +accels, no decels Toco: contractions q3-4 min Cat: I  Twin B Baseline 145, moderate variability, +accels, no decels Toco: contractions q3-4 min Cat: I  Labs Results for orders placed or performed during the hospital encounter of 12/29/20 (from the past 24 hour(s))  CBC     Status: Abnormal   Collection Time: 12/29/20  2:40 PM  Result Value Ref Range   WBC 13.1 (H) 4.0 - 10.5 K/uL   RBC 5.05 3.87 - 5.11 MIL/uL   Hemoglobin 11.4 (L) 12.0 - 15.0 g/dL   HCT 03.2 12.2 - 48.2 %   MCV 74.9 (L) 80.0 - 100.0 fL   MCH 22.6 (L) 26.0 - 34.0 pg   MCHC 30.2 30.0 - 36.0 g/dL   RDW 50.0 (H) 37.0 - 48.8 %   Platelets 343 150 - 400 K/uL   nRBC 0.2 0.0 - 0.2 %    Imaging No results found.  MAU Course  Procedures Lab Orders         Resp Panel by RT-PCR (Flu A&B, Covid) Nasopharyngeal Swab         CBC         Comprehensive metabolic panel         Protein / creatinine ratio, urine         Fern Test     Meds ordered this encounter  Medications   azithromycin (ZITHROMAX) tablet 1,000 mg   FOLLOWED BY Linked Order Group    ampicillin (OMNIPEN) 2 g in sodium chloride 0.9 % 100 mL IVPB     Order Specific Question:   Antibiotic Indication:     Answer:   PPROM    amoxicillin (AMOXIL) capsule 500 mg   magnesium bolus via infusion 4 g   magnesium sulfate 40 grams in SWI 1000 mL OB infusion   betamethasone acetate-betamethasone sodium phosphate (CELESTONE) injection 12 mg   lactated ringers infusion   Imaging Orders         [place] MFM OB LIMITED      MDM High  Assessment and Plan  #Preterm premature rupture of membranes #Mono-di twin gestation #[redacted] weeks  gestation of pregnancy Patient with large gush of fluid, confirmed rupture with positive fern slide. Obtaining limited US to assess AFI for both twins. Discussed with patient she will need to admitted and monitored. She is having some contractions on the monitor but not feeling them. Start magensium, latency antibiotics, also ordered for betamethasone. Unfortunately twin A is currently breech. Patient last ate around 11:45 AM, NPO for time being.  Discussed with Dr. Conni Elliot, she will admit patient.   #Gestational hypertension vs Pre-eclampsia Multiple mild range BP's both in clinic and now here at [facility]. PreE labs pending to clarify diagnosis though UPCR will be of questionable accuracy given PPROM, consider straight cath.   #FWB Twin A FHT Cat I NST: Reactive  Twin B FHT Cat I NST: Reactive  Dispo: to be admitted to antepartum by Dr. Conni Elliot, care transferred.   Venora Maples, MD/MPH 12/29/20 3:34 PM

## 2020-12-29 NOTE — Anesthesia Postprocedure Evaluation (Signed)
Anesthesia Post Note  Patient: Jody Watson  Procedure(s) Performed: Primary CESAREAN SECTION MULTI-GESTATIONAL     Patient location during evaluation: PACU Anesthesia Type: Spinal Level of consciousness: oriented and awake and alert Pain management: pain level controlled Vital Signs Assessment: post-procedure vital signs reviewed and stable Respiratory status: spontaneous breathing, respiratory function stable and nonlabored ventilation Cardiovascular status: blood pressure returned to baseline and stable Postop Assessment: no headache, no backache and no apparent nausea or vomiting Anesthetic complications: no   No notable events documented.  Last Vitals:  Vitals:   12/29/20 1930 12/29/20 1945  BP: (!) 72/60 136/86  Pulse: (!) 107 (!) 115  Resp: 15 (!) 30  Temp: 36.7 C   SpO2: 99% 100%    Last Pain:  Vitals:   12/29/20 1945  TempSrc:   PainSc: 3    Pain Goal: Patients Stated Pain Goal: 0 (12/29/20 1945)  LLE Motor Response: Purposeful movement (12/29/20 1945) LLE Sensation: Tingling (12/29/20 1945) RLE Motor Response: Purposeful movement (12/29/20 1945) RLE Sensation: Tingling (12/29/20 1945)     Epidural/Spinal Function Cutaneous sensation: Tingles (12/29/20 1945), Patient able to flex knees: Yes (12/29/20 1945), Patient able to lift hips off bed: Yes (12/29/20 1945), Back pain beyond tenderness at insertion site: No (12/29/20 1945), Progressively worsening motor and/or sensory loss: No (12/29/20 1945), Bowel and/or bladder incontinence post epidural: No (12/29/20 1945)  Leinani Lisbon A.

## 2020-12-29 NOTE — Transfer of Care (Signed)
Immediate Anesthesia Transfer of Care Note  Patient: Jody Watson  Procedure(s) Performed: Primary CESAREAN SECTION MULTI-GESTATIONAL  Patient Location: PACU  Anesthesia Type:Spinal  Level of Consciousness: awake, alert  and patient cooperative  Airway & Oxygen Therapy: Patient Spontanous Breathing  Post-op Assessment: Report given to RN and Post -op Vital signs reviewed and stable  Post vital signs: Reviewed and stable  Last Vitals:  Vitals Value Taken Time  BP 95/73 12/29/20 1901  Temp    Pulse 96 12/29/20 1904  Resp 16 12/29/20 1904  SpO2 100 % 12/29/20 1904  Vitals shown include unvalidated device data.  Last Pain:  Vitals:   12/29/20 1730  TempSrc:   PainSc: 4          Complications: No notable events documented.

## 2020-12-29 NOTE — Anesthesia Procedure Notes (Signed)
Spinal  Start time: 12/29/2020 5:37 PM End time: 12/29/2020 5:39 PM Reason for block: surgical anesthesia Staffing Performed: anesthesiologist  Anesthesiologist: Shelton Silvas, MD Preanesthetic Checklist Completed: patient identified, IV checked, site marked, risks and benefits discussed, surgical consent, monitors and equipment checked, pre-op evaluation and timeout performed Spinal Block Patient position: sitting Prep: DuraPrep and site prepped and draped Location: L3-4 Injection technique: single-shot Needle Needle type: Pencan  Needle gauge: 24 G Needle length: 10 cm Needle insertion depth: 10 cm Additional Notes Patient tolerated well. No immediate complications.  Functioning IV was confirmed and monitors were applied. Sterile prep and drape, including hand hygiene and sterile gloves were used. The patient was positioned and the back was prepped. The skin was anesthetized with lidocaine. Free flow of clear CSF was obtained prior to injecting local anesthetic into the CSF. The spinal needle aspirated freely following injection. The needle was carefully withdrawn. The patient tolerated the procedure well.;

## 2020-12-29 NOTE — Op Note (Signed)
Jody Watson 05/18/1995 [id number]   OPERATIVE NOTE  PROCEDURE: primary low transverse cesarean section  PRE-OPERATIVE DIAGNOSIS:  Monochorionic-diamniotic twin gestation at 31 weeks 6 days Preterm premature rupture of membranes Active labor Breech presentation Baby A   POST-OPERATIVE DIAGNOSIS: Monochorionic-diamniotic twin gestation at 31 weeks 6 days, delivered  Preterm premature rupture of membranes Active labor Breech presentation Baby A   SURGEON: Cincere Deprey, DO  ASSISTANT: Arlan Organ, CNM  FINDINGS: normal female gravid anatomy including uterus, bilateral fallopian tubes, and bilateral ovaries. Baby A female infant in the frank breech presentation apgars per NICU weight 4 pounds 3 ounces. Baby B female infant in the vertex presentation apgars per NICU weight 4 pounds 10 ounces.  EBL: 657 cc  FLUIDS: 2,000 cc crystalloid  URINE OUTPUT: 150 cc clear  COMPLICATIONS: none  PROCEDURE IN DETAIL:  After the patient was appropriately consented she was taken to the operating room where regional anesthesia was obtained without complications. The patient was placed in the dorsal supine position with leftward tilt. Fetal heart tones were obtained and found to be reassuring x2. A Foley catheter was placed and the bladder was drained for clear yellow urine and remained in place for the duration of the procedure. The patient was prepped and draped in the usual sterile fashion. An appropriate time out was performed that verified the correct patient, procedure, and surgical team.   The scalpel was used to make a low transverse skin incision. The incision was carried down to the fascia, maintaining hemostasis with the Bovie as needed, The fascia was incised to the left and the right of the midline. The fascia incision was carried laterally using the curved Mayo scissors on either side. The inferior aspect of the fascia was grasped with the Kocher clamps, tented upwards, and the  underlying rectus muscle dissected off bluntly and sharply with curved Mayo scissors. The Kocher clamps were removed, placed on the superior aspect of the fascia and the rectus muscles dissected off in a similar fashion. The rectus muscles were divided at the midline bluntly. The peritoneum was identified, grasped with hemostat clamps and entered sharply with the Metzenbaum scissors. The incision was then extended laterally by bluntly stretching. The bladder blade was introduced. The vesicouterine peritoneum was dissected off the lower uterine segment and a bladder flap was created digitally. The scalpel was used to make a low transverse uterine incision. The incision was extended caudad and cephalad by bluntly stretching. The presenting Baby A was palpated for it's presenting part which was noted to be the sacrum and the amniotic membranes were ruptured with an Alis clamp for copious amounts of clear fluid. The sacrum was delivered through the hysterotomy with knees extended, the body and shoulders delivered promptly and the head was delivered while maintaining flexion at the neck. Cord clamping was deferred at the direction of the Neonatologist given minimal respiratory efforts. The cord was clamped and cut and infant handed off to the NICU team. Then Baby B presentation was palpated and found to be vertex, amniotic membranes ruptured with an Alis clamp for copious clear fluid. The head was delivered maintaining flexion of the neck and the remaining shoulders and body delivered easily. After sixty seconds of delayed cord clamping the cord was clamped, cut, and the infant handed off to the waiting NICU team. Cord blood gasses were collected for both. A Kelly clamp was marked for Baby A and the umbilical clamp marked for Baby B. The placenta was delivered by manual extraction and  sent for pathology. The uterine cavity was cleared of any clot and debris. The hysterotomy was closed using 0-Monocryl in a running  locking fashion. A second layer closure was performed using the same suture. Adequate hemostasis of the hysterotomy was noted. The bilateral gutters were cleared of all clot and debris. Bilateral tubes and ovaries were inspected and found to be within normal limits. The underlying fascia planes were inspected and found to be hemostatic. The fascia was closed with 0-Vicryl a normal running fashion. The subcutaneous layer hemostasis obtained with the Bovie. The subcutaneous layer was closed with 2-0 plain. The skin layer was closed with 4-0 Vicryl. The patient tolerated the procedure well and was taken to the recovery room in stable condition. All instrument, needle, and sponge counts were correct.   Sabino Denning A Henretter Piekarski 12/29/20 6:57 PM

## 2020-12-29 NOTE — Progress Notes (Signed)
Patient receiving second Venofer infusion. Half-way through infusion, the patient called RN in the room and reported that her water had broken. Patient reported feeling a large gush of warm liquid. Patient also reported that she was consistently leaking fluid since initial gush.  Called [facility] and spoke with Barrie Dunker, Charity fundraiser. Notified Tamra that patient has reported that her water broke. Tamra contacted Dr. Juliene Pina. Dr. Juliene Pina advised that since patient is not having contractions and is stable that she can receive 1 more hour of iron infusion and then patient is to report to Maternal Assessment Unit (MAU) for assessment. Will continue infusion and continue to monitor patient.

## 2020-12-29 NOTE — MAU Note (Signed)
Clear fluid still coming with movement, first gush at 1230.  Iron infusion was not completed. cramping, uncomfortable but not painful.

## 2020-12-29 NOTE — Progress Notes (Signed)
PATIENT CARE CENTER NOTE   Diagnosis: Anemia complicating pregnancy in second trimester (O99.012)   Provider: Shea Evans, MD   Procedure: Venofer infusion    Note:  Patient received Venofer 500 mg infusion (dose 2 of 2) via PIV. Pre medications given per order. Patient reported that her water had broken half-way through infusion so infusion stopped 1 hour early.  Patient's BP elevated at 142/99 post infusion. Discharge instructions given to patient and patient's husband. Patient to immediately report to Maternity Assessment Unit (MAU).  Patient alert, oriented and ambulatory at discharge. Discharged with husband.

## 2020-12-29 NOTE — Discharge Instructions (Signed)
Report to the Maternity Assessment Unit (MAU) located on the [facility].  Phone # [phone].

## 2020-12-30 ENCOUNTER — Encounter (HOSPITAL_COMMUNITY): Payer: Self-pay | Admitting: Obstetrics and Gynecology

## 2020-12-30 DIAGNOSIS — O9902 Anemia complicating childbirth: Secondary | ICD-10-CM | POA: Diagnosis present

## 2020-12-30 DIAGNOSIS — F419 Anxiety disorder, unspecified: Secondary | ICD-10-CM | POA: Diagnosis present

## 2020-12-30 DIAGNOSIS — Z2839 Other underimmunization status: Secondary | ICD-10-CM | POA: Diagnosis present

## 2020-12-30 LAB — CBC
HCT: 32 % — ABNORMAL LOW (ref 36.0–46.0)
Hemoglobin: 9.9 g/dL — ABNORMAL LOW (ref 12.0–15.0)
MCH: 23 pg — ABNORMAL LOW (ref 26.0–34.0)
MCHC: 30.9 g/dL (ref 30.0–36.0)
MCV: 74.2 fL — ABNORMAL LOW (ref 80.0–100.0)
Platelets: 324 10*3/uL (ref 150–400)
RBC: 4.31 MIL/uL (ref 3.87–5.11)
RDW: 21.3 % — ABNORMAL HIGH (ref 11.5–15.5)
WBC: 23.2 10*3/uL — ABNORMAL HIGH (ref 4.0–10.5)
nRBC: 0.1 % (ref 0.0–0.2)

## 2020-12-30 MED ORDER — MEASLES, MUMPS & RUBELLA VAC IJ SOLR
0.5000 mL | Freq: Once | INTRAMUSCULAR | Status: AC | PRN
  Administered 2020-12-31: 0.5 mL via SUBCUTANEOUS
  Filled 2020-12-30: qty 0.5

## 2020-12-30 MED ORDER — POLYSACCHARIDE IRON COMPLEX 150 MG PO CAPS
150.0000 mg | ORAL_CAPSULE | Freq: Every day | ORAL | Status: DC
  Administered 2020-12-30 – 2021-01-01 (×3): 150 mg via ORAL
  Filled 2020-12-30 (×3): qty 1

## 2020-12-30 MED ORDER — BACITRACIN-NEOMYCIN-POLYMYXIN OINTMENT TUBE
TOPICAL_OINTMENT | CUTANEOUS | Status: DC | PRN
  Filled 2020-12-30: qty 14

## 2020-12-30 MED ORDER — MAGNESIUM OXIDE -MG SUPPLEMENT 400 (240 MG) MG PO TABS
400.0000 mg | ORAL_TABLET | Freq: Every day | ORAL | Status: DC
  Administered 2020-12-30 – 2021-01-01 (×3): 400 mg via ORAL
  Filled 2020-12-30 (×3): qty 1

## 2020-12-30 NOTE — Lactation Note (Signed)
This note was copied from a baby's chart.  NICU Lactation Consultation Note  Patient Name: Jody Watson Today's Date: 12/30/2020 Age:25 hours   Subjective Reason for consult: Initial assessment; Multiple gestation [facility] assisted with initial pumping and provided education.  LC observed mother to have erythemic R breast. Mother complains of itching but denies pain. LC relayed to RN.  Objective Infant data: Mother's Current Feeding Choice: Breast Milk   Maternal data: [id number]  C-Section, Low Transverse Does the patient have breastfeeding experience prior to this delivery?: No Pump: DEBP, Personal (Medela)  Assessment Maternal: Erythemic R breast may be related to increased blood flow or recent anesthesia.   Normal breast development + breast changes during pregnancy  Intervention/Plan Interventions: Education; [employer] brochure; "The NICU and Your Baby" book; Infant Driven Feeding Algorithm education  Tools: Pump Pump Education: Setup, frequency, and cleaning; Milk Storage  Plan: Consult Status: Follow-up  NICU Follow-up type: New admission follow up; Verify absence of engorgement; Verify onset of copious milk  Mother to pump q3 today  Elder Negus 12/30/2020, 12:32 PM

## 2020-12-30 NOTE — Progress Notes (Signed)
Subjective: POD# 1   Jody, Watson [[id number]]  Live born female Jody Watson Birth Weight: 4 lb 3 oz (1900 g) APGAR: 4, 8  Newborn Delivery   Birth date/time: 12/29/2020 18:05:00 Delivery type: C-Section, Low Transverse Trial of labor: No C-section categorization: Primary       Jody, Watson [[id number]]  Live born female Jody Watson Birth Weight: 4 lb 10.1 oz (2100 g) APGAR: 7, 9  Newborn Delivery   Birth date/time: 12/[birth year]/2020 18:06:00 Delivery type: C-Section, Low Transverse Trial of labor: No C-section categorization: Primary    NICU admit twins for prematurity  Delivering provider:    Vickki Watson [[id number]]  LAW, CASSANDRA Jody, Watson [[id number]]  Jody Watson   Feeding: breast/pumping  Pain control at delivery:    Jody, Watson [[id number]]  Spinal    Jody, Watson [[id number]]  Spinal   Reports feeling well.  Patient reports tolerating PO.   Breast symptoms:none Pain controlled with  PO meds Denies HA/SOB/C/P/N/V/dizziness. Flatus absent. She reports vaginal bleeding as normal, without clots.  She has ambulated briefly in room, visited babies in NICU, foley cath in place and draining clear yellow urine.  Objective:   VS:    Vitals:   12/29/20 2041 12/29/20 2144 12/30/20 0132 12/30/20 0605  BP: 128/76 131/86 120/73 111/66  Pulse: (!) 109 (!) 106 95 93  Resp: 20 19 20 18   Temp: 99.8 F (37.7 C) 98.6 F (37 C) 98.4 F (36.9 C) 98.4 F (36.9 C)  TempSrc: Oral Oral Oral Oral  SpO2: 95% 96% 96% 99%  Weight:      Height:         Intake/Output Summary (Last 24 hours) at 12/30/2020 0659 Last data filed at 12/30/2020 9518 Gross per 24 hour  Intake 2000 ml  Output 1962 ml  Net 38 ml        Recent Labs    12/29/20 1440 12/30/20 0439  WBC 13.1* 23.2*  HGB 11.4* 9.9*  HCT 37.8 32.0*  PLT 343 324     Blood type: --/--/O POS (12/29 1707)  Rubella:  non-immune  Vaccines: TDaP           UTD         Flu             declined                    COVID-19 declined   Physical Exam:  General: alert, cooperative, and no distress CV: Regular rate and rhythm Resp: clear Abdomen: soft, nontender, normal bowel sounds Incision: clean, dry, and intact Uterine Fundus: firm, below umbilicus, nontender Lochia: minimal Ext: no edema, redness or tenderness in the calves or thighs  Assessment/Plan: 25 y.o.   POD# 1. [id number]                  Principal Problem:   Preterm premature rupture of membranes Active Problems:   Maternal anemia, with delivery IDA w/ ABL  - start oral Fe and Mag Ox   Cesarean delivery - MoDi twins, PPROM 31.6 wks   Postpartum care following cesarean delivery 12/29   Anxiety disorder  - stable on BuSpar and Zoloft, continue as established   Rubella non-immune status, delivered, current hospitalization  - Offer MMR prior to DC  Doing well, stable.               Advance diet as tolerated Breastfeeding support Encourage to ambulate,  warm fluids for gut motility Routine post-op care  Jody Watson, CNM, MSN 12/30/2020, 6:59 AM

## 2020-12-31 LAB — RPR: RPR Ser Ql: NONREACTIVE

## 2020-12-31 NOTE — Lactation Note (Signed)
This note was copied from a baby's chart. Lactation Consultation Note  Patient Name: Shawnell Dykes Today's Date: 12/31/2020 Reason for consult: Primapara;1st time breastfeeding;Follow-up assessment;NICU baby;Multiple gestation;Maternal endocrine disorder;Preterm <34wks;Infant < 6lbs Age:25 hours  Visited with mom of 40 hours old pre-term NICU twins, she reports that she's getting some drops when pumping/hand expressing but it's not enough to make it through the bottle on the pump. Asked mom to collect these drops directly from the flange for oral care, babies currently on donor milk.  Assisted mom with her pumping session, provided a pumping band; she was also given coconut oil, her nipples started getting a bit sore yesterday but they're intact. Reviewed lactogenesis II, pumping schedule, expectations, benefits of premature milk and supply/demand.  Maternal Data  Mom's supply is WNL  Feeding Mother's Current Feeding Choice: Breast Milk and Donor Milk  Lactation Tools Discussed/Used Tools: Pump;Flanges;Coconut oil Flange Size: 24 Breast pump type: Double-Electric Breast Pump Pump Education: Setup, frequency, and cleaning;Milk Storage Reason for Pumping: pre-term twins in NICU Pumping frequency: 3 times/24 hours Pumped volume:  (droplets)  Interventions Interventions: Breast feeding basics reviewed;Breast massage;Breast compression;Coconut oil;DEBP;Education  Plan of care  Encouraged mom to start pumping consistently, at least 8 times/24 hours Breast massage, hand expression and coconut oil were also encouraged prior pumping Mom will take all her pump parts to baby's room after her discharge, she'll also switch from initiation to expression setting after her milk comes in  FOB present and supportive. All questions and concerns answered, parents to call NICU LC PRN.  Discharge Pump: DEBP;Personal (Medela DEBP at home)  Consult Status Consult Status: Follow-up Date:  12/31/20 Follow-up type: In-patient   Gamble Enderle Venetia Constable 12/31/2020, 10:56 AM

## 2020-12-31 NOTE — Progress Notes (Signed)
No c/o; pain controlled; tol po; ambulating, +flatus; voiding w/o difficulty; nml lochia Babies in NICU, pumping  Patient Vitals for the past 24 hrs:  BP Temp Temp src Pulse Resp SpO2  12/31/20 1217 120/68 97.8 F (36.6 C) Oral 75 18 99 %  12/31/20 0853 119/82 97.8 F (36.6 C) Oral 88 19 98 %  12/31/20 0350 -- -- -- -- -- 98 %  12/31/20 0349 130/73 97.7 F (36.5 C) Oral 91 18 --  12/30/20 2340 -- -- -- -- -- 98 %  12/30/20 2339 111/67 97.9 F (36.6 C) Oral 82 18 --  12/30/20 2134 116/67 98 F (36.7 C) Oral (!) 108 17 99 %  12/30/20 1558 110/70 98.6 F (37 C) Oral 77 17 99 %   A&ox3 Rrr Ctab Abd: soft, nt,nd, +bs; fundus firm and below umb; dressing c/d/I LE: no edema, nt  CBC Latest Ref Rng & Units 12/30/2020 12/29/2020 03/11/2020  WBC 4.0 - 10.5 K/uL 23.2(H) 13.1(H) 12.5(H)  Hemoglobin 12.0 - 15.0 g/dL 9.9(L) 11.4(L) 13.4  Hematocrit 36.0 - 46.0 % 32.0(L) 37.8 40.5  Platelets 150 - 400 K/uL 324 343 247   A/P: pod 2 s/p 1ltcs for mono/di twins, active labor Doing well, contin current care; uncertain if wanting  Twin girls/preterm -  NICU IDA with abl - asymptomatic, iron q day Rh pos Rubella NI, plan mmr at d/c Anxiety - stable, buspar/zoloft

## 2021-01-01 LAB — BPAM RBC
Blood Product Expiration Date: 202301192359
Blood Product Expiration Date: 202301202359
ISSUE DATE / TIME: 202212290837
ISSUE DATE / TIME: 202212290845
Unit Type and Rh: 5100
Unit Type and Rh: 5100

## 2021-01-01 LAB — TYPE AND SCREEN
ABO/RH(D): O POS
Antibody Screen: NEGATIVE
Unit division: 0
Unit division: 0

## 2021-01-01 MED ORDER — IBUPROFEN 600 MG PO TABS: 600.0000 mg | ORAL_TABLET | Freq: Four times a day (QID) | ORAL | 0 refills | Status: DC

## 2021-01-01 MED ORDER — OXYCODONE HCL 5 MG PO TABS
5.0000 mg | ORAL_TABLET | Freq: Four times a day (QID) | ORAL | 0 refills | Status: DC | PRN
Start: 2021-01-01 — End: 2023-07-09

## 2021-01-01 NOTE — Progress Notes (Signed)
No c/o; feels much better; +flatus, pain controlled Tol po, ambulating; voiding w/o difficulty Babies stable in NICU, pumping; nml lochia  Patient Vitals for the past 24 hrs:  BP Temp Temp src Pulse Resp SpO2  01/01/21 0923 119/81 98.3 F (36.8 C) Oral 90 18 99 %  01/01/21 0526 124/68 (!) 97.4 F (36.3 C) Oral 80 17 97 %  12/31/20 1955 120/64 98.2 F (36.8 C) Oral 79 18 99 %  12/31/20 1700 120/80 98.2 F (36.8 C) Oral 81 17 99 %  12/31/20 1217 120/68 97.8 F (36.6 C) Oral 75 18 99 %   A&ox3 Rrr Ctab Abd: +bs, soft, nt,nd; fundus firm and below umb; dressing: c/d/I LE: no edema, nt bilat  CBC Latest Ref Rng & Units 12/30/2020 12/29/2020 03/11/2020  WBC 4.0 - 10.5 K/uL 23.2(H) 13.1(H) 12.5(H)  Hemoglobin 12.0 - 15.0 g/dL 9.9(L) 11.4(L) 13.4  Hematocrit 36.0 - 46.0 % 32.0(L) 37.8 40.5  Platelets 150 - 400 K/uL 324 343 247   A/P: pod  s/p 1ltcs for mono/di twins, active labor Doing well, d/c home today; plan d/c today Twin girls/preterm -  NICU IDA with abl - asymptomatic, iron q day Rh pos Rubella NI, plan mmr at d/c Anxiety - stable, buspar/zoloft; f/u in office in 2 wk

## 2021-01-01 NOTE — Lactation Note (Signed)
This note was copied from a baby's chart.  NICU Lactation Consultation Note  Patient Name: Jody Watson INOMV'E Date: 01/01/2021 Age:26   Subjective Reason for consult: Maternal discharge; Follow-up assessment Mother has increased breast fullness today and collecting an increased milk volume. She denies pain or trouble pumping.   We reviewed s/s of engorgement and strategies prn. We also reviewed IDF prn.   Objective Infant data: Mother's Current Feeding Choice: Breast Milk  Infant feeding assessment No data recorded    Maternal data: [id number]  C-Section, Low Transverse Pumping frequency: q3 Pumped volume: 60 mL Flange Size: 24  Pump: DEBP, Personal (Medela DEBP at home)  Assessment Maternal: Milk volume: Normal  Mother is at an increased risk of engorgement because of multiple gestation. Pumping frequently will decrease that risk.   Intervention/Plan Interventions: Education; Infant Driven Feeding Algorithm education  Tools: Pump; Flanges; Coconut oil; Hands-free pumping top Pump Education: Setup, frequency, and cleaning; Milk Storage  Plan: Consult Status: Follow-up  NICU Follow-up type: Verify absence of engorgement; Weekly NICU follow up; Assist with IDF-1 (Mother to pre-pump before breastfeeding)  Mother to continue pumping q3. Mother to use ice packs prior to pumping and warm, moist heat prn during pumping.  LC will plan f/u to assist with lick and learn prn.  Elder Negus 01/01/2021, 12:19 PM

## 2021-01-01 NOTE — Discharge Summary (Signed)
Postpartum Discharge Summary  Date of Service updated     Patient Name: Jody Watson DOB: 05/13/1995 MRN: [id number]  Date of admission: 12/29/2020 Delivery date:   Brent Bullarcher, GirlA Trinisha [[id number]][[id number]]  12/29/2020    Lillia Carmelrcher, GirlB Shandreka [[id number]][[id number]]  12/29/2020  Delivering provider:    Brent BullaArcher, GirlA Jameeka [[id number]][[id number]]  LAW, Pilar PlateCASSANDRA A    Hanshaw, GirlB Chandi [[id number]][[id number]]  LAW, CASSANDRA A  Date of discharge: 01/01/2021  Admitting diagnosis: Preterm premature rupture of membranes [O42.919] Intrauterine pregnancy: 6457w6d     Secondary diagnosis:  Principal Problem:   Postpartum care following cesarean delivery 12/29 Active Problems:   Preterm premature rupture of membranes   Maternal anemia, with delivery IDA w/ ABL   Cesarean delivery - MoDi twins, PPROM 31.6 wks   Anxiety disorder   Rubella non-immune status, delivered, current hospitalization  Additional problems: active labor    Discharge diagnosis: Preterm Pregnancy Delivered and Anemia                                              Post partum procedures: none Augmentation: N/A Complications: None  Hospital course: Onset of Labor With Unplanned C/S   26 y.o. yo [id number] at 3557w6d was admitted in Active Labor and srom on 12/29/2020.  The patient went for cesarean section due to  mono/di twins preterm . Delivery details as follows: Membrane Rupture Time/Date:    Brent Bullarcher, GirlA Shemica [[id number]][[id number]]  12:30 PM    Lillia CarmelArcher, GirlB Chirstina [[id number]][[id number]]  6:06 PM ,   Brent Bullarcher, GirlA Sequoia [[id number]][[id number]]  12/29/2020    Lillia Carmelrcher, GirlB Alyzae [[id number]][[id number]]  12/29/2020   Delivery Method:   Brent BullaArcher, GirlA Livia [[id number]][[id number]]  C-Section, Low Transverse    Lillia Carmelrcher, GirlB Tamarah [[id number]][[id number]]  C-Section, Low Transverse  Details of operation can be found in separate operative note. Patient had an uncomplicated postpartum course.  She is ambulating,tolerating a regular diet, passing flatus, and urinating well.  Patient is discharged home in  stable condition 01/01/21.  Newborn Data: Birth date:   Brent Bullarcher, GirlA Emaly [[id number]][[id number]]  12/29/2020    Lillia Carmelrcher, GirlB Brandie [[id number]][[id number]]  12/29/2020  Birth time:   Brent Bullarcher, GirlA Arrionna [[id number]][[id number]]  6:05 PM    Lillia CarmelArcher, GirlB Franny [[id number]][[id number]]  6:06 PM  Gender:   Brent Bullarcher, GirlA Jaquitta [[id number]][[id number]]  Female    Lillia Carmelrcher, GirlB Glen [[id number]][[id number]]  Female  Living status:   Brent Bullarcher, GirlA Katisha [[id number]][[id number]]  Living    Lillia Carmelrcher, GirlB Tannis [[id number]][[id number]]  Living  Apgars:   Brent Bullarcher, GirlA Zabrina [[id number]][[id number]]  [address]    [place], GirlB Adysson [[id number]][[id number]]  7 ,   Brent Bullarcher, GirlA Thersea [[id number]][[id number]]  8    Lillia Carmelrcher, GirlB Kamarie [[id number]][[id number]]  9  Weight:   Brent Bullarcher, GirlA Genavie [[id number]][[id number]]  1900 g    Lillia Carmelrcher, GirlB Ynez [[id number]][[id number]]  2100 g    Physical exam  Vitals:   12/31/20 1700 12/31/20 1955 01/01/21 0526 01/01/21 0923  BP: 120/80 120/64 124/68 119/81  Pulse: 81 79 80 90  Resp: 17 18 17 18   Temp: 98.2 F (36.8 C) 98.2 F (36.8 C) (!) 97.4 F (36.3 C) 98.3 F (36.8 C)  TempSrc: Oral Oral Oral Oral  SpO2: 99% 99% 97% 99%  Weight:      Height:       General: alert, cooperative, and no distress Lochia: appropriate Uterine Fundus: firm Incision: Healing well with no  significant drainage DVT Evaluation: No evidence of DVT seen on physical exam. Labs: Lab Results  Component Value Date   WBC 23.2 (H) 12/30/2020   HGB 9.9 (L) 12/30/2020   HCT 32.0 (L) 12/30/2020   MCV 74.2 (L) 12/30/2020   PLT 324 12/30/2020   CMP Latest Ref Rng & Units 12/29/2020  Glucose 70 - 99 mg/dL 88  BUN 6 - 20 mg/dL <7(W)  Creatinine 2.95 - 1.00 mg/dL 6.21  Sodium 308 - 657 mmol/L 138  Potassium 3.5 - 5.1 mmol/L 4.0  Chloride 98 - 111 mmol/L 107  CO2 22 - 32 mmol/L 23  Calcium 8.9 - 10.3 mg/dL 9.2  Total Protein 6.5 - 8.1 g/dL 6.0(L)  Total Bilirubin 0.3 - 1.2 mg/dL 0.6  Alkaline Phos 38 - 126 U/L 143(H)  AST 15 - 41 U/L 15  ALT 0 - 44 U/L 15   Edinburgh Score: Edinburgh Postnatal  Depression Scale Screening Tool 12/30/2020  I have been able to laugh and see the funny side of things. 0  I have looked forward with enjoyment to things. 0  I have blamed myself unnecessarily when things went wrong. 1  I have been anxious or worried for no good reason. 2  I have felt scared or panicky for no good reason. 0  Things have been getting on top of me. 0  I have been so unhappy that I have had difficulty sleeping. 0  I have felt sad or miserable. 1  I have been so unhappy that I have been crying. 0  The thought of harming myself has occurred to me. 0  Edinburgh Postnatal Depression Scale Total 4      After visit meds:  Allergies as of 01/01/2021       Reactions   Bee Venom Anaphylaxis   Tape Dermatitis   Paper tape         Medication List     STOP taking these medications    aspirin EC 81 MG tablet   promethazine 25 MG tablet Commonly known as: PHENERGAN       TAKE these medications    busPIRone 5 MG tablet Commonly known as: BUSPAR Take 5 mg by mouth daily.   cetirizine 10 MG tablet Commonly known as: ZyrTEC Allergy Take 1 tablet (10 mg total) by mouth daily.   EPINEPHrine 0.3 mg/0.3 mL Soaj injection Commonly known as: EPI-PEN Inject 0.3 mg into the muscle as needed for anaphylaxis.   ibuprofen 600 MG tablet Commonly known as: ADVIL Take 1 tablet (600 mg total) by mouth every 6 (six) hours.   ipratropium 0.03 % nasal spray Commonly known as: ATROVENT Place 2 sprays into both nostrils 2 (two) times daily.   MULTIVITAMIN GUMMIES CHILDRENS PO Take 2 tablets by mouth daily.   oxyCODONE 5 MG immediate release tablet Commonly known as: Oxy IR/ROXICODONE Take 1-2 tablets (5-10 mg total) by mouth every 6 (six) hours as needed for moderate pain.   sertraline 25 MG tablet Commonly known as: ZOLOFT Take 25 mg by mouth daily.   tobramycin 0.3 % ophthalmic solution Commonly known as: Tobrex Place 1 drop into both eyes every 4 (four) hours.          Discharge home in stable condition Infant Feeding:  per NICU Infant Disposition:NICU Discharge instruction: per After Visit Summary and Postpartum booklet. Activity: Advance as tolerated. Pelvic rest for 6 weeks.  Diet: routine diet Anticipated Birth Control: Unsure Postpartum Appointment:6 weeks Additional Postpartum F/U: Postpartum Depression checkup 2  wks Future Appointments:No future appointments. Follow up Visit:  Follow-up Information     Shea Evans, MD Follow up.   Specialty: Obstetrics and Gynecology Contact information: [address] [place] [address] [phone]                     01/01/2021 Vick Frees, MD

## 2021-01-02 ENCOUNTER — Ambulatory Visit: Payer: Self-pay

## 2021-01-02 NOTE — Lactation Note (Signed)
This note was copied from a baby's chart.  NICU Lactation Consultation Note  Patient Name: Jody Watson [id number] Date: 01/02/2021 Age:26 days   Subjective Reason for consult: Follow-up assessment Mother is pumping very often to avoid breast discomfort and over-fullness. We reviewed the likely need to continue q2 pumping through about the 2nd week pp. We also reviewed s/s of engorgement and strategies to avoid/treat.   Objective Infant data: Mother's Current Feeding Choice: Breast Milk  Maternal data: [id number]  C-Section, Low Transverse Pumping frequency: q2 hours Pumped volume: 120 mL  Pump: DEBP, Personal (Medela DEBP at home)  Assessment Maternal: Milk volume: Abundant   Intervention/Plan Interventions: Education  Plan: Consult Status: Follow-up  NICU Follow-up type: Verify absence of engorgement; Weekly NICU follow up    Gwynne Edinger 01/02/2021, 2:06 PM

## 2021-01-03 LAB — SURGICAL PATHOLOGY

## 2021-01-10 ENCOUNTER — Telehealth (HOSPITAL_COMMUNITY): Payer: Self-pay

## 2021-01-10 ENCOUNTER — Ambulatory Visit: Payer: Managed Care, Other (non HMO)

## 2021-01-10 ENCOUNTER — Ambulatory Visit: Payer: Self-pay

## 2021-01-10 NOTE — Lactation Note (Signed)
This note was copied from a baby's chart. Lactation Consultation Note  Patient Name: Jody Watson [id number] Date: 01/10/2021 Reason for consult: Follow-up assessment;Maternal endocrine disorder;NICU baby;Multiple gestation;Preterm <34wks;1st time breastfeeding;Primapara;Infant < 6lbs Age:26 days  Visited with mom of 58 61/69 weeks old (adjusted) NICU twins, she's a P1 and reports an oversupply at 6 pp. Mom voices that she can't go more than 2-3 hours without pumping because her breasts start hurting.   Reviewed some strategies to calibrate her supply, mom aware that if supply dwindles below 8 oz/pumping session, she needs to call NICU lactation. Discussed pumping schedule, lactogenesis III and supply/demand.  Maternal Data  Mom's supply is abundant and ANL  Feeding Mother's Current Feeding Choice: Breast Milk  Lactation Tools Discussed/Used Tools: Pump;Flanges Flange Size: 24 Breast pump type: Double-Electric Breast Pump Pump Education: Setup, frequency, and cleaning;Milk Storage Reason for Pumping: pre-term twins in NICU Pumping frequency: 8-9 times/24 hours Pumped volume: 300 mL (300-360 ml)  Interventions Interventions: Breast feeding basics reviewed;DEBP;Education  Plan of care   Encouraged mom to continue pumping consistently every 2-3 hours, at least 8 times/24 hours She won't be fully emptying the breasts on each pumping session, will monitor for the next 5-7 days   FOB present and supportive. All questions and concerns answered, parents to call NICU LC PRN.  Discharge Pump: DEBP;Personal (Medela DEBP at home)  Consult Status Consult Status: Follow-up Date: 01/10/21 Follow-up type: In-patient   Alfred Harrel Venetia Constable 01/10/2021, 7:08 PM

## 2021-01-10 NOTE — Telephone Encounter (Signed)
No answer. Left message to return nurse call.  Marcelino Duster [facility] 01/10/2021,1406

## 2021-01-14 ENCOUNTER — Ambulatory Visit: Payer: Self-pay

## 2021-01-14 NOTE — Lactation Note (Signed)
This note was copied from a baby's chart. Lactation Consultation Note  Patient Name: Jody Watson [id number] Date: 01/14/2021 Reason for consult: Follow-up assessment;Mother's request;Primapara;1st time breastfeeding;NICU baby;Preterm <34wks;Multiple gestation;Breastfeeding assistance Age:26 wk.o.  Lactation assisted with latching baby Jody Watson to the right breast using a size 20 NS. Baby open her mouth for the NS and held in mouth primarily, but began to suckle after several minutes. We noted a few suckles interspersed with long pauses. Some intermittent swallows were noted. Baby showed good repetition of skills, as she took a break and then began to cue again and latch to the NS. I worked with Jody Watson on her positioning at the breast (football hold), and we reviewed some of the recommendations for modulating her milk volume provided by a Advertising copywriter earlier this week.  Jody Watson is pre-pumping, but continues to have copious milk volume. We discussed signs of distress (stop signs) at the breast.  Feeding Mother's Current Feeding Choice: Breast Milk  LATCH Score Latch: Repeated attempts needed to sustain latch, nipple held in mouth throughout feeding, stimulation needed to elicit sucking reflex.  Audible Swallowing: A few with stimulation  Type of Nipple: Everted at rest and after stimulation  Comfort (Breast/Nipple): Soft / non-tender  Hold (Positioning): Assistance needed to correctly position infant at breast and maintain latch.  LATCH Score: 7   Lactation Tools Discussed/Used Tools: Nipple Shields Nipple shield size: 20 Pumping frequency: q3 hours Pumped volume: 300 mL (sometimes pumps up to 16 ounces)  Interventions Interventions: Breast feeding basics reviewed;Assisted with latch;Skin to skin;Hand express;Adjust position;Support pillows;Education  Consult Status Consult Status: Follow-up Date: 01/14/21 Follow-up type: In-patient    Walker Shadow 01/14/2021, 11:53 AM

## 2021-01-14 NOTE — Lactation Note (Signed)
This note was copied from a baby's chart. Lactation Consultation Note  Patient Name: Jody Watson [id number] Date: 01/14/2021 Reason for consult: Follow-up assessment;Mother's request;Primapara;1st time breastfeeding;NICU baby;Preterm <34wks;Infant < 6lbs;Breastfeeding assistance;Multiple gestation Age:26 wk.o.  63 - Lactation assisted with latching baby Carrolyn Meiers to the left breast using a size 20 NS. Baby latched readily and showed several suckling sequences interspersed with pauses. Baby appeared relaxed and would restart suckling on her own after a pause. Ms. Cresswell is pre-pumping, but continues to have copious milk volume. We discussed signs of distress (stop signs) at the breast.  Maternal Data Does the patient have breastfeeding experience prior to this delivery?: No  Feeding Mother's Current Feeding Choice: Breast Milk  LATCH Score Latch: Grasps breast easily, tongue down, lips flanged, rhythmical sucking.  Audible Swallowing: A few with stimulation  Type of Nipple: Everted at rest and after stimulation  Comfort (Breast/Nipple): Soft / non-tender  Hold (Positioning): Assistance needed to correctly position infant at breast and maintain latch.  LATCH Score: 8   Interventions Interventions: Breast feeding basics reviewed;Assisted with latch;Skin to skin;Hand express;Adjust position;Support pillows;Education  Discharge Pump: DEBP  Consult Status Consult Status: Follow-up Date: 01/14/21 Follow-up type: In-patient    Lenore Manner 01/14/2021, 11:48 AM

## 2021-01-20 ENCOUNTER — Ambulatory Visit: Payer: Self-pay

## 2021-01-20 NOTE — Lactation Note (Signed)
This note was copied from a baby's chart. Lactation Consultation Note  Patient Name: Jody Watson [id number] Date: 01/20/2021 Reason for consult: Follow-up assessment;NICU baby;Multiple gestation;Maternal endocrine disorder;Mother's request;Late-preterm 34-36.6wks;Primapara;1st time breastfeeding;Infant < 6lbs Age:26 wk.o.  Visited with mom of 18 93/68 weeks old (adjusted) NICU twins, she requested LC because she had a "knot" on the lower right quadrant of her R breast; she was afraid it was mastitis or something was getting infected because she reported some pain at rest; but it didn't affect her supply on that side, she's still getting her regular output. Mom voiced the right side started to bother her this morning.  Breast tissue and nipple/areola complex look intact upon examination, no red streaks or any signs of mastitis/engorgement, milk was flowing nicely when mom was pumping during [facility] consultation. Reviewed prevention/treatment for clogged ducts and how to do deep tissue massage and hot/cold therapy to help out with the pain. She'll also take ibuprofen for pain/inflammation.  Maternal Data  Mom's supply is ANL, we discussed some strategies to gradually down-regulate her supply. Mom voiced she's been working on it already. She doesn't fully empty the breasts during daytime, but she does at night so she can sleep another hour or two.   Feeding Mother's Current Feeding Choice: Breast Milk  Lactation Tools Discussed/Used Tools: Pump;Flanges Flange Size: 24 Breast pump type: Double-Electric Breast Pump Pump Education: Setup, frequency, and cleaning;Milk Storage Reason for Pumping: LPI twins in NICU Pumping frequency: q 3-4 hours Pumped volume: 420 mL (8-10 oz (R) and 6-8 oz (L))  Interventions Interventions: Breast massage;Hand express;DEBP;Ice;Education  Plan of care   Encouraged mom to continue pumping consistently every 3-4 hours, will try not to fully emptying the  breasts She'll continue taking babies to breast on feeding cues, will pump "some" as needed just to take the edge off due to her supply She'll continue doing with hot/cold treatments on R breast, deep tissue massage and ibuprofen as needed   GOB present and supportive. All questions and concerns answered, family to call NICU LC PRN.   Discharge Pump: DEBP;Personal (Medela DEBP at home)  Consult Status Consult Status: Follow-up Date: 01/20/21 Follow-up type: In-patient   Jody Watson Jody Watson 01/20/2021, 7:09 PM

## 2021-01-23 ENCOUNTER — Ambulatory Visit: Payer: Managed Care, Other (non HMO)

## 2021-01-29 ENCOUNTER — Ambulatory Visit: Payer: Self-pay

## 2021-01-29 NOTE — Lactation Note (Signed)
This note was copied from a baby's chart. Lactation Consultation Note  Patient Name: Jody Watson [id number] Date: 01/29/2021 Reason for consult: Follow-up assessment;Maternal endocrine disorder;NICU baby;Multiple gestation;Late-preterm 34-36.6wks;1st time breastfeeding;Primapara;Infant < 6lbs Age:26 wk.o.  Visited with mom of 45 43/54 weeks old (adjusted) NICU twins girls, they're both going home today. Reviewed discharge education, pumping schedule, infant feeding and engorgement prevention/treatment.  Mom voiced that she has slowed down with her pumping because she's been taking babies to breast, however, they're going to breast only once/day. Explained to mom the importance of consistent pumping/milk removal to protect her supply, parents understands that babies might to be able to fully empty the breast at this point and that pumping after feedings at the breast will be crucial to build her supply back up.   Mom's goal is to continue working on BF, LC OP referral to [facility]. sent for both babies. She also voiced that the "knot" she had last week self-resolved with hot/cold treatments and that her breast are fine now and no longer bothering her. She also voiced that she had plenty of frozen milk at home because she was dealing with oversupply during NICU stay.  Maternal Data  Mom's supply has decreased in half, probably due to decreased pumping. She's planning on building it back up, reviewed some strategies such as power pumping, continuing taking babies to breast and pumping after feedings at the breast.  Feeding Mother's Current Feeding Choice: Breast Milk Nipple Type: Dr. Myra Gianotti Preemie  Lactation Tools Discussed/Used Tools: Pump;Flanges Flange Size: 24 Breast pump type: Double-Electric Breast Pump Pump Education: Setup, frequency, and cleaning;Milk Storage Reason for Pumping: LPI twins in NICU Pumping frequency: 4 times/24 hours Pumped volume: 240 mL  Interventions   Education  Plan of care Encouraged mom to start pumping consistently every 3-4 hours  She'll continue taking babies to breast on feeding cues She'll pump after feedings at the breast Will F/U with LC OP   FOB present and supportive. All questions and concerns answered, family to contact NICU LC PRN.   Discharge Discharge Education: Outpatient recommendation;Outpatient Epic message sent Pump: DEBP;Personal (Medela DEBP at home)  Consult Status Consult Status: Complete Date: 01/29/21 Follow-up type: Call as needed   [place] 01/29/2021, 12:30 PM

## 2021-02-05 ENCOUNTER — Inpatient Hospital Stay (HOSPITAL_COMMUNITY): Admit: 2021-02-05 | Payer: Managed Care, Other (non HMO) | Admitting: Obstetrics & Gynecology

## 2021-05-01 ENCOUNTER — Encounter: Payer: Self-pay | Admitting: Family Medicine

## 2021-05-01 ENCOUNTER — Ambulatory Visit (INDEPENDENT_AMBULATORY_CARE_PROVIDER_SITE_OTHER): Payer: Managed Care, Other (non HMO) | Admitting: Family Medicine

## 2021-05-01 VITALS — BP 114/80 | HR 81 | Temp 97.5°F | Ht 63.0 in | Wt 175.8 lb

## 2021-05-01 DIAGNOSIS — Z0000 Encounter for general adult medical examination without abnormal findings: Secondary | ICD-10-CM

## 2021-05-01 NOTE — Progress Notes (Signed)
? ?Subjective:  ? ? Patient ID: Jody Watson, female    DOB: Jun 01, 1995, 26 y.o.   MRN: [id number] ? ?HPI ?Patient is a very pleasant 26 year old Caucasian female who is here today to establish care.  She was in the hospital in December with preeclampsia.  She had twin babies that were born via C-section 2 months early due to preeclampsia.  She was on magnesium during the delivery but did not suffer a seizure.  Pregnancy was complicated by low iron as well as gestational hypertension.  She also reports that she had a previous history of hypothyroidism and at 1 point was on medication although they stopped that in the past and she does not recall why.  She is not currently on any medications other than ibuprofen occasionally.  She is breast-feeding her 2 children who are 55 months old.  She denies any concerns today.  Her immunizations are up-to-date except for the COVID-vaccine but she elects not to receive that. ?Past Medical History:  ?Diagnosis Date  ? Anemia   ? none recent  ? Anxiety   ? Asthma   ? no inhalers used, denies current problem  ? COVID jan 18th 2022  ? rapid home sob stomach pain n/v diarrhea x 2 weeks allsymptoms resoleved  ? Depression   ? Headache   ? PCOS (polycystic ovarian syndrome)   ? UTI (urinary tract infection)   ? ?Past Surgical History:  ?Procedure Laterality Date  ? CESAREAN SECTION MULTI-GESTATIONAL N/A 12/29/2020  ? Procedure: Primary CESAREAN SECTION MULTI-GESTATIONAL;  Surgeon: Clance Boll A, DO;  Location: [facility];  Service: Obstetrics;  Laterality: N/A;  EDD: 02/24/21  ? DILATION AND EVACUATION N/A 03/11/2020  ? Procedure: DILATATION AND EVACUATION;  Surgeon: Shea Evans, MD;  Location: [facility];  Service: Gynecology;  Laterality: N/A;  ? OPERATIVE ULTRASOUND N/A 03/11/2020  ? Procedure: OPERATIVE ULTRASOUND;  Surgeon: Shea Evans, MD;  Location: [facility];  Service: Gynecology;  Laterality: N/A;  ? TONSILLECTOMY  as child  ? wisdome teeth  extraction  age 70  ? ?Current Outpatient Medications on File Prior to Visit  ?Medication Sig Dispense Refill  ? ibuprofen (ADVIL) 600 MG tablet Take 1 tablet (600 mg total) by mouth every 6 (six) hours. (Patient not taking: Reported on 05/01/2021) 30 tablet 0  ? oxyCODONE (OXY IR/ROXICODONE) 5 MG immediate release tablet Take 1-2 tablets (5-10 mg total) by mouth every 6 (six) hours as needed for moderate pain. (Patient not taking: Reported on 05/01/2021) 20 tablet 0  ? ?No current facility-administered medications on file prior to visit.  ? ? ?Allergies  ?Allergen Reactions  ? Bee Venom Anaphylaxis  ? Tape Dermatitis  ?  Paper tape   ? ?Social History  ? ?Socioeconomic History  ? Marital status: Married  ?  Spouse name: Not on file  ? Number of children: Not on file  ? Years of education: Not on file  ? Highest education level: Not on file  ?Occupational History  ? Not on file  ?Tobacco Use  ? Smoking status: Never  ? Smokeless tobacco: Never  ?Vaping Use  ? Vaping Use: Never used  ?Substance and Sexual Activity  ? Alcohol use: Not Currently  ? Drug use: No  ? Sexual activity: Yes  ?  Comment: Nuvaring  ?Other Topics Concern  ? Not on file  ?Social History Narrative  ? Not on file  ? ?Social Determinants of Health  ? ?Financial Resource Strain: Not on  file  ?Food Insecurity: Not on file  ?Transportation Needs: Not on file  ?Physical Activity: Not on file  ?Stress: Not on file  ?Social Connections: Not on file  ?Intimate Partner Violence: Not on file  ? ?Family History  ?Problem Relation Age of Onset  ? Fibromyalgia Mother   ? Asthma Father   ? Diabetes Father   ? Alcohol abuse Maternal Grandfather   ? Diabetes Paternal Grandfather   ?Mom has migraines ? ? ?Review of Systems  ?All other systems reviewed and are negative. ? ?   ?Objective:  ? Physical Exam ?Vitals reviewed.  ?Constitutional:   ?   General: She is not in acute distress. ?   Appearance: Normal appearance. She is not ill-appearing, toxic-appearing or  diaphoretic.  ?HENT:  ?   Right Ear: Tympanic membrane and ear canal normal.  ?   Left Ear: Tympanic membrane and ear canal normal.  ?   Nose: Nose normal.  ?   Mouth/Throat:  ?   Mouth: Mucous membranes are moist.  ?   Pharynx: No oropharyngeal exudate or posterior oropharyngeal erythema.  ?Eyes:  ?   Conjunctiva/sclera: Conjunctivae normal.  ?   Pupils: Pupils are equal, round, and reactive to light.  ?Neck:  ?   Vascular: No carotid bruit.  ?Cardiovascular:  ?   Rate and Rhythm: Normal rate and regular rhythm.  ?   Pulses: Normal pulses.  ?   Heart sounds: Normal heart sounds. No murmur heard. ?  No friction rub. No gallop.  ?Pulmonary:  ?   Effort: Pulmonary effort is normal. No respiratory distress.  ?   Breath sounds: Normal breath sounds. No stridor.  ?Abdominal:  ?   General: Bowel sounds are normal. There is no distension.  ?   Palpations: Abdomen is soft.  ?   Tenderness: There is no abdominal tenderness. There is no guarding.  ?Lymphadenopathy:  ?   Cervical: No cervical adenopathy.  ?Skin: ?   General: Skin is warm.  ?   Coloration: Skin is not jaundiced.  ?   Findings: No bruising, lesion or rash.  ?Neurological:  ?   General: No focal deficit present.  ?   Mental Status: She is alert and oriented to person, place, and time. Mental status is at baseline.  ?   Cranial Nerves: No cranial nerve deficit.  ?   Motor: No weakness.  ?   Coordination: Coordination normal.  ?   Gait: Gait normal.  ?   Deep Tendon Reflexes: Reflexes normal.  ? ? ? ? ? ?   ?Assessment & Plan:  ? ?General medical exam - Plan: TSH, CBC with Differential/Platelet, COMPLETE METABOLIC PANEL WITH GFR, Lipid panel ?Physical exam today is normal except for a BMI of 31.  The patient is interested in phentermine along with therapeutic lifestyle changes however I recommended waiting until after she finishes breast-feeding her children prior to doing this.  I would recommend returning fasting for CBC CMP and a lipid panel along with a TSH.  I  am checking a lipid panel as she has a history of premature cardiovascular disease on her father side as her grandfather died at age 58 from a heart attack and was not a smoker.  Her father also has diabetes and hyperlipidemia per her report.  I will check a TSH given the vague history of hypothyroidism just to ensure that this is not currently active.  She can return fasting at any point for this.  I  did recommend a multivitamin due to the fact she is breast-feeding. ?

## 2021-05-19 ENCOUNTER — Other Ambulatory Visit: Payer: Managed Care, Other (non HMO)

## 2021-05-20 LAB — LIPID PANEL
Cholesterol: 212 mg/dL — ABNORMAL HIGH (ref <200)
HDL: 54 mg/dL (ref >=50)
LDL Cholesterol (Calc): 139 mg/dL (calc) — ABNORMAL HIGH
Non-HDL Cholesterol (Calc): 158 mg/dL (calc) — ABNORMAL HIGH (ref <130)
Total CHOL/HDL Ratio: 3.9 (calc) (ref <5.0)
Triglycerides: 88 mg/dL (ref <150)

## 2021-05-20 LAB — COMPLETE METABOLIC PANEL WITH GFR
AG Ratio: 2 (calc) (ref 1.0–2.5)
ALT: 18 U/L (ref 6–29)
AST: 14 U/L (ref 10–30)
Albumin: 4.6 g/dL (ref 3.6–5.1)
Alkaline phosphatase (APISO): 93 U/L (ref 31–125)
BUN: 18 mg/dL (ref 7–25)
CO2: 25 mmol/L (ref 20–32)
Calcium: 9.5 mg/dL (ref 8.6–10.2)
Chloride: 106 mmol/L (ref 98–110)
Creat: 0.7 mg/dL (ref 0.50–0.96)
Globulin: 2.3 g/dL (calc) (ref 1.9–3.7)
Glucose, Bld: 88 mg/dL (ref 65–99)
Potassium: 4.7 mmol/L (ref 3.5–5.3)
Sodium: 143 mmol/L (ref 135–146)
Total Bilirubin: 0.4 mg/dL (ref 0.2–1.2)
Total Protein: 6.9 g/dL (ref 6.1–8.1)
eGFR: 123 mL/min/{1.73_m2} (ref >=60)

## 2021-05-20 LAB — CBC WITH DIFFERENTIAL/PLATELET
Absolute Monocytes: 360 cells/uL (ref 200–950)
Basophils Absolute: 58 cells/uL (ref 0–200)
Basophils Relative: 0.8 %
Eosinophils Absolute: 216 cells/uL (ref 15–500)
Eosinophils Relative: 3 %
HCT: 46.3 % — ABNORMAL HIGH (ref 35.0–45.0)
Hemoglobin: 15 g/dL (ref 11.7–15.5)
Lymphs Abs: 1483 cells/uL (ref 850–3900)
MCH: 27.2 pg (ref 27.0–33.0)
MCHC: 32.4 g/dL (ref 32.0–36.0)
MCV: 83.9 fL (ref 80.0–100.0)
MPV: 10.4 fL (ref 7.5–12.5)
Monocytes Relative: 5 %
Neutro Abs: 5083 cells/uL (ref 1500–7800)
Neutrophils Relative %: 70.6 %
Platelets: 302 10*3/uL (ref 140–400)
RBC: 5.52 10*6/uL — ABNORMAL HIGH (ref 3.80–5.10)
RDW: 13.7 % (ref 11.0–15.0)
Total Lymphocyte: 20.6 %
WBC: 7.2 10*3/uL (ref 3.8–10.8)

## 2021-05-20 LAB — TSH: TSH: 1.72 mIU/L

## 2021-07-06 ENCOUNTER — Encounter: Payer: Self-pay | Admitting: Family Medicine

## 2021-07-10 ENCOUNTER — Other Ambulatory Visit: Payer: Self-pay | Admitting: Family Medicine

## 2021-07-10 MED ORDER — PHENTERMINE HCL 37.5 MG PO CAPS: 37.5000 mg | ORAL_CAPSULE | ORAL | 2 refills | Status: DC

## 2021-10-29 ENCOUNTER — Encounter: Payer: Self-pay | Admitting: Family Medicine

## 2021-11-09 NOTE — Telephone Encounter (Signed)
Per pt stated that her insurance has approve for [facility]? What dose? Pls advice

## 2021-11-10 ENCOUNTER — Other Ambulatory Visit: Payer: Self-pay | Admitting: Family Medicine

## 2021-11-10 MED ORDER — WEGOVY 0.5 MG/0.5ML ~~LOC~~ SOAJ: 0.5000 mg | SUBCUTANEOUS | 1 refills | Status: DC

## 2021-11-10 NOTE — Telephone Encounter (Signed)
Spoke w/pt and is aware Rx-Wegovy has been sent to pt's pharmacy. Pt voiced understanding.

## 2021-11-29 ENCOUNTER — Other Ambulatory Visit: Payer: Self-pay | Admitting: Family Medicine

## 2021-11-29 MED ORDER — PHENTERMINE HCL 37.5 MG PO CAPS: 37.5000 mg | ORAL_CAPSULE | ORAL | 2 refills | Status: DC

## 2022-06-16 ENCOUNTER — Other Ambulatory Visit: Payer: Self-pay | Admitting: Family Medicine

## 2022-06-18 NOTE — Telephone Encounter (Signed)
Requested medication (s) are due for refill today: Yes  Requested medication (s) are on the active medication list: Yes  Last refill:  11/29/21  Future visit scheduled: No  Notes to clinic:  Not delegated.    Requested Prescriptions  Pending Prescriptions Disp Refills   phentermine 37.5 MG capsule [Pharmacy Med Name: PHENTERMINE 37.5MG  CAPSULES] 30 capsule     Sig: TAKE 1 CAPSULE(37.5 MG) BY MOUTH EVERY MORNING     Not Delegated - Neurology: Anticonvulsants - Controlled - phentermine hydrochloride Failed - 06/16/2022  1:23 PM      Failed - This refill cannot be delegated      Failed - eGFR in normal range and within 360 days    GFR, Estimated  Date Value Ref Range Status  12/29/2020 >60 >60 mL/min Final    Comment:    (NOTE) Calculated using the CKD-EPI Creatinine Equation (2021)    eGFR  Date Value Ref Range Status  05/19/2021 123 > OR = 60 mL/min/1.56m2 Final    Comment:    The eGFR is based on the CKD-EPI 2021 equation. To calculate  the new eGFR from a previous Creatinine or Cystatin C result, go to https://www.kidney.org/professionals/ kdoqi/gfr%5Fcalculator          Failed - Cr in normal range and within 360 days    Creat  Date Value Ref Range Status  05/19/2021 0.70 0.50 - 0.96 mg/dL Final   Creatinine, Urine  Date Value Ref Range Status  12/29/2020 52.82 mg/dL Final         Failed - Valid encounter within last 6 months    Recent Outpatient Visits           1 year ago General medical exam   [facility] Donita Brooks, MD              Failed - Weight completed in the last 3 months    Wt Readings from Last 1 Encounters:  05/01/21 175 lb 12.8 oz (79.7 kg)         Passed - Last BP in normal range    BP Readings from Last 1 Encounters:  05/01/21 114/80

## 2022-06-25 ENCOUNTER — Other Ambulatory Visit: Payer: Self-pay | Admitting: Family Medicine

## 2022-09-24 ENCOUNTER — Other Ambulatory Visit: Payer: Self-pay

## 2022-10-02 ENCOUNTER — Encounter: Payer: Self-pay | Admitting: Family Medicine

## 2022-10-26 ENCOUNTER — Encounter: Payer: Self-pay | Admitting: Family Medicine

## 2022-11-26 ENCOUNTER — Encounter: Payer: Self-pay | Admitting: Family Medicine

## 2022-12-01 IMAGING — US US MFM OB FOLLOW-UP EACH ADDL GEST (MODIFY)
1 series · 12 of 28 positions shown · non-contrast
Comparison: none

[Series 1: us mfm ob follow-up each addl gest (modify) · 12 of 50 slices shown]
[im 2/50]
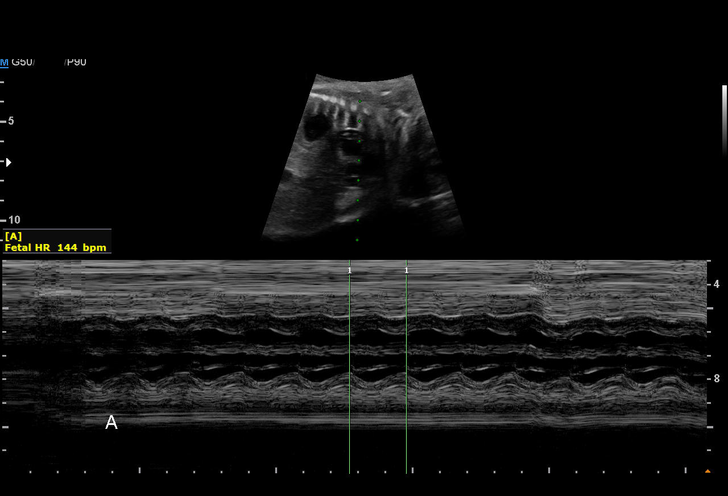
[im 6/50]
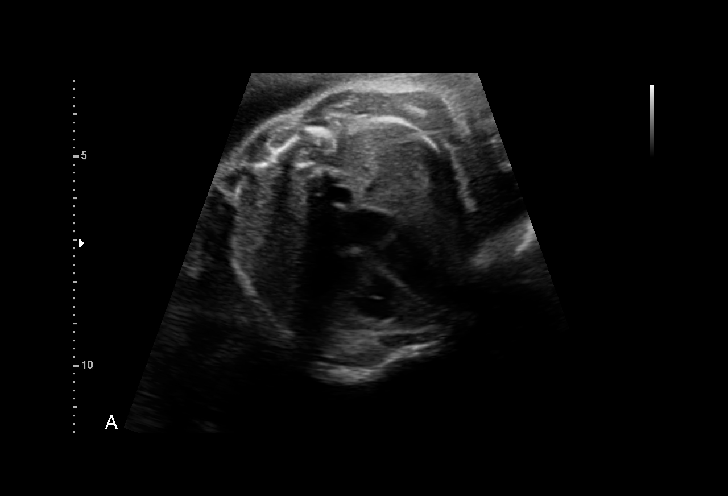
[im 10/50]
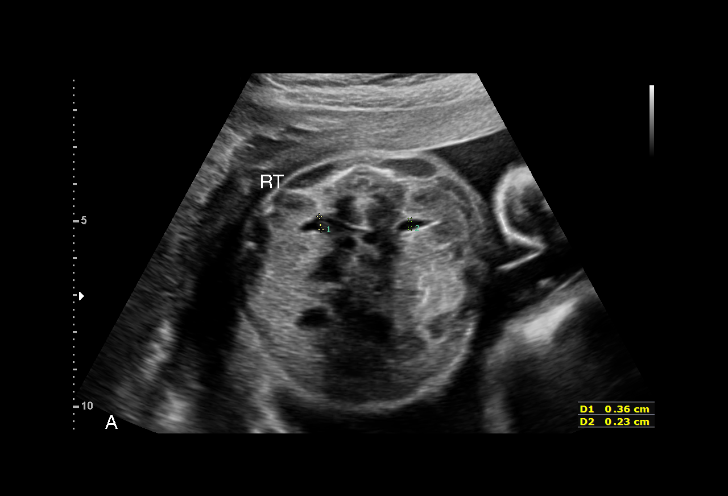
[im 15/50]
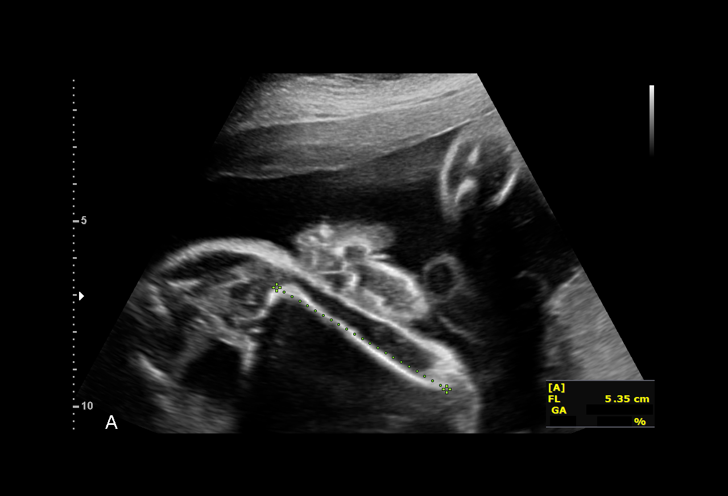
[im 19/50]
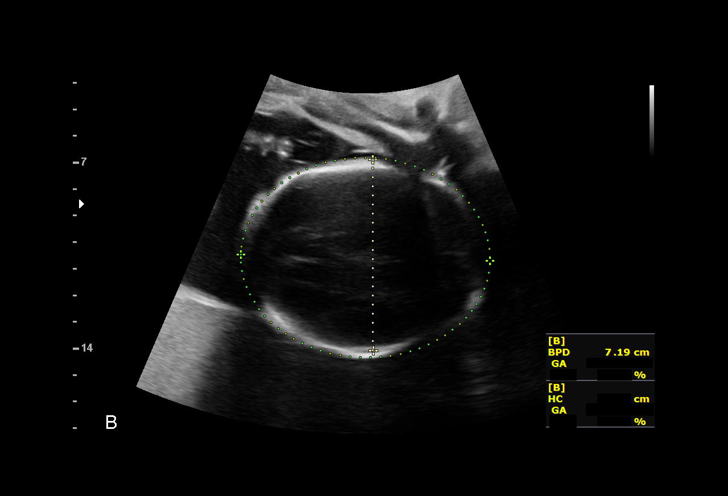
[im 22/50]
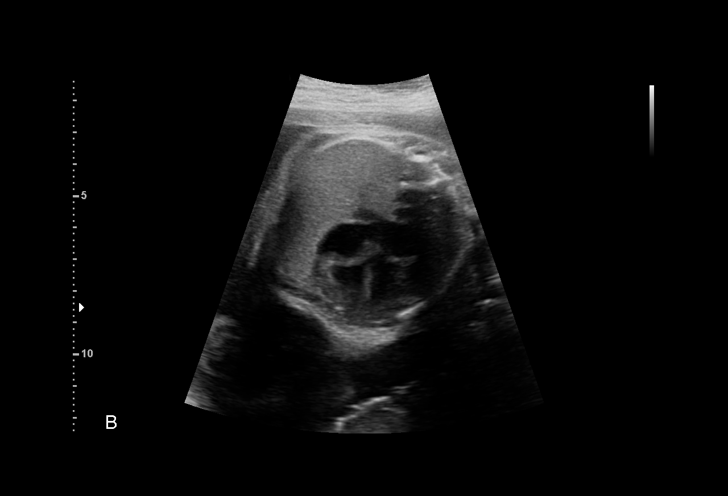
[im 28/50]
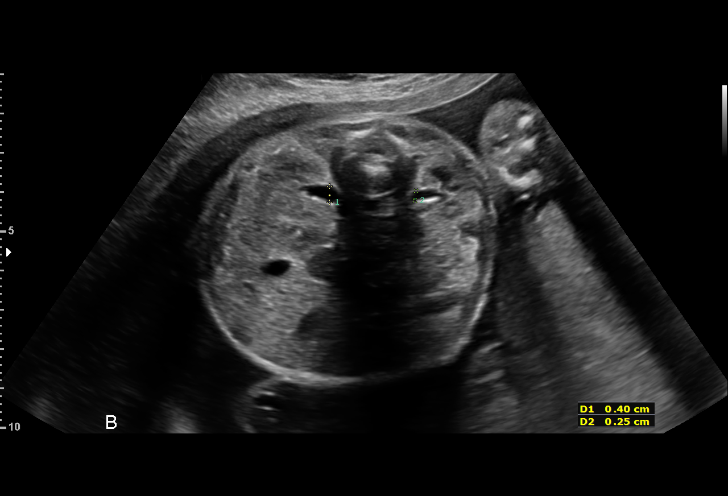
[im 31/50]
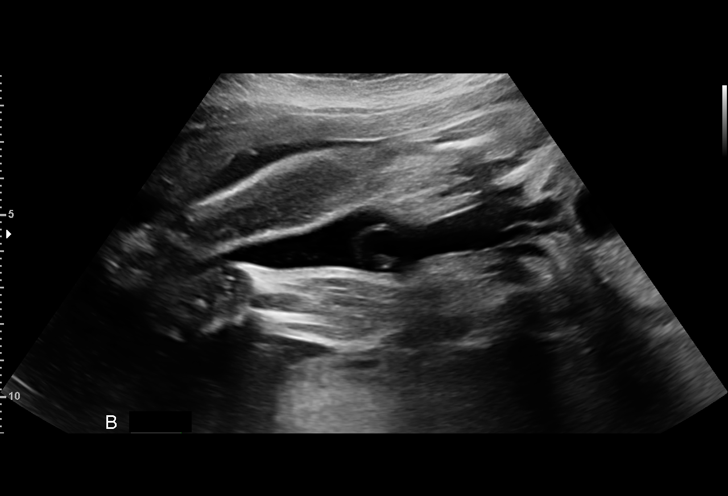
[im 35/50]
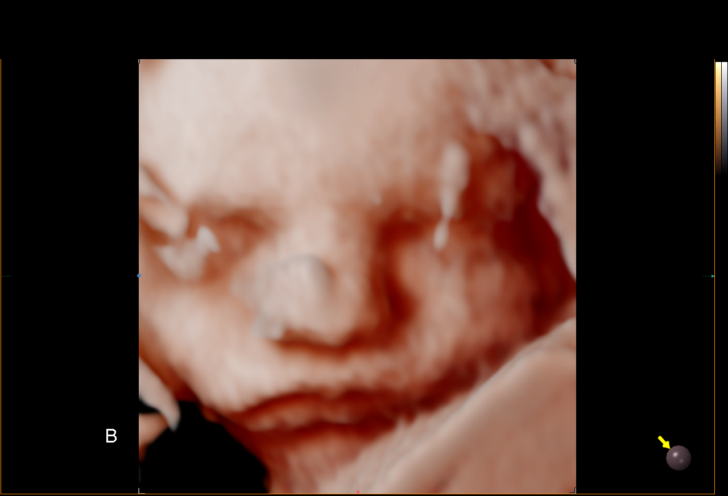
[im 40/50]
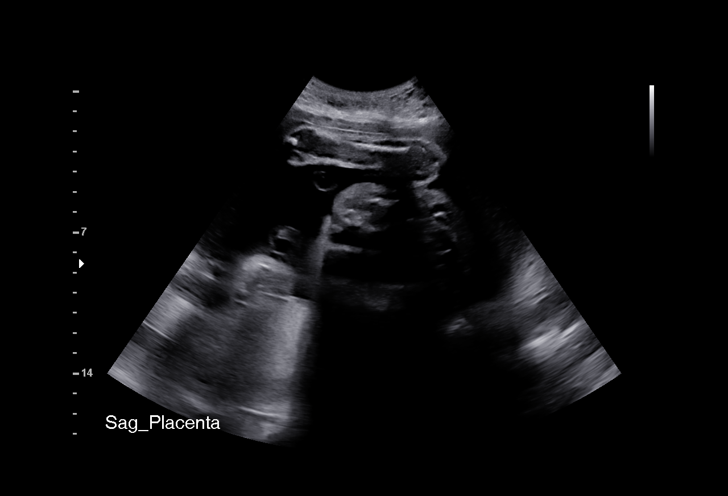
[im 44/50]
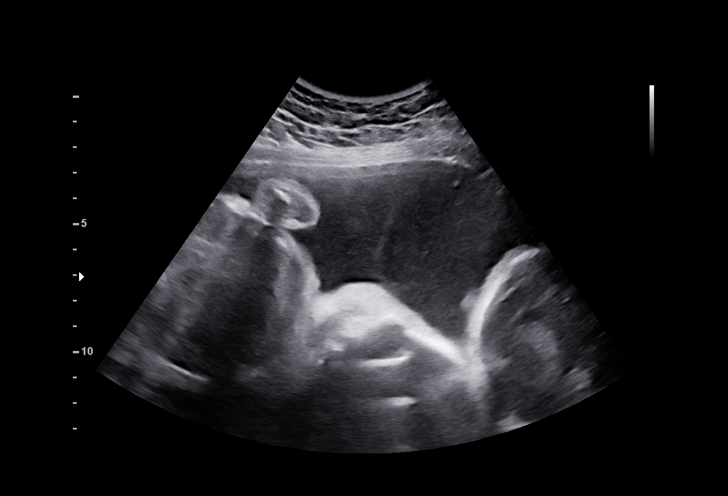
[im 48/50]
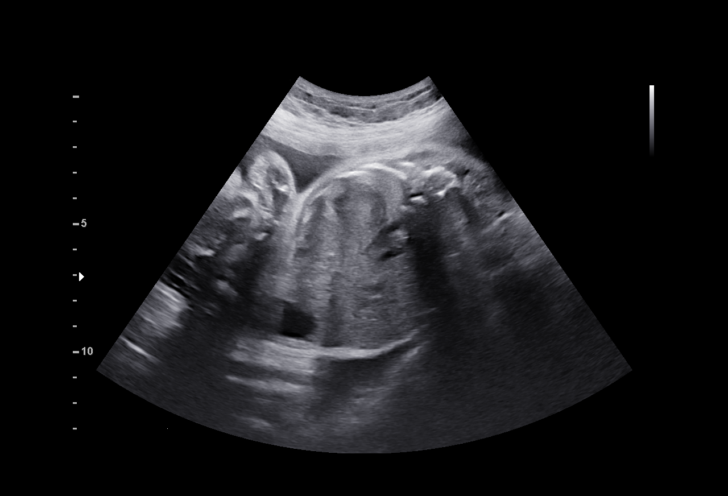

[12 of 28 positions shown; findings below may reference images not displayed]

KIRSTEN ELISABETH
    JOSIEL ALEJANDRO                                              KIRSTEN ELISABETH

Indications

 Twin pregnancy, Humera/Baiye, second trimester
 Previous pregnancy with congenital
 anomaly, antepartum (cystic hygroma)
 27 weeks gestation of pregnancy
Fetal Evaluation (Fetus A)

 Num Of Fetuses:         2
 Fetal Heart Rate(bpm):  144
 Cardiac Activity:       Observed
 Fetal Lie:              Maternal right side
 Presentation:           Cephalic
 Placenta:               Posterior
 P. Cord Insertion:      Previously Visualized
 Membrane Desc:      Dividing Membrane seen - Monochorionic

 Amniotic Fluid
 AFI FV:      Within normal limits

                             Largest Pocket(cm)

Biometry (Fetus A)
 BPD:      71.7  mm     G. Age:  28w 5d         81  %    CI:        75.58   %    70 - 86
                                                         FL/HC:      19.2   %    18.6 -
 HC:      261.5  mm     G. Age:  28w 3d         54  %    HC/AC:      1.14        1.05 -
 AC:      228.5  mm     G. Age:  27w 2d         35  %    FL/BPD:     70.0   %    71 - 87
 FL:       50.2  mm     G. Age:  27w 0d         23  %    FL/AC:      22.0   %    20 - 24

 LV:        3.6  mm

 Est. FW:    4304  gm      2 lb 5 oz     34  %     FW Discordancy        12  %
OB History

 Gravidity:    2         Term:   0        Prem:   0        SAB:   0
 TOP:          1       Ectopic:  0        Living: 0
Gestational Age (Fetus A)

 LMP:           51w 6d        Date:  12/01/19                 EDD:   09/06/20
 U/S Today:     27w 6d                                        EDD:   02/21/21
 Best:          27w 3d     Det. By:  Early Ultrasound         EDD:   02/24/21
                                     (08/01/20)
Anatomy (Fetus A)

 Cranium:               Appears normal         Aortic Arch:            Previously seen
 Cavum:                 Previously seen        Ductal Arch:            Previously seen
 Ventricles:            Appears normal         Diaphragm:              Previously seen
 Choroid Plexus:        Previously seen        Stomach:                Appears normal, left
                                                                       sided
 Cerebellum:            Previously seen        Abdomen:                Previously seen
 Posterior Fossa:       Previously seen        Abdominal Wall:         Previously seen
 Nuchal Fold:           Not applicable (>20    Cord Vessels:           Previously seen
                        wks GA)
 Face:                  Orbits and profile     Kidneys:                Appear normal
                        previously seen
 Lips:                  Not well visualized    Bladder:                Appears normal
 Thoracic:              Previously seen        Spine:                  Previously seen
 Heart:                 Appears normal         Upper Extremities:      Previously seen
                        (4CH, axis, and
                        situs)
 RVOT:                  Previously seen        Lower Extremities:      Previously seen
 LVOT:                  Previously seen

Fetal Evaluation (Fetus B)

 Num Of Fetuses:         2
 Fetal Heart Rate(bpm):  147
 Cardiac Activity:       Observed
 Fetal Lie:              Maternal left side
 Presentation:           Cephalic
 Placenta:               Posterior
 P. Cord Insertion:      Previously Visualized
 Membrane Desc:      Dividing Membrane seen - Monochorionic

 Amniotic Fluid
 AFI FV:      Within normal limits

                             Largest Pocket(cm)

Biometry (Fetus B)

 BPD:      70.4  mm     G. Age:  28w 2d         67  %    CI:        71.41   %    70 - 86
                                                         FL/HC:      19.8   %    18.6 -
 HC:      265.3  mm     G. Age:  28w 6d         68  %    HC/AC:      1.09        1.05 -
 AC:      242.9  mm     G. Age:  28w 4d         76  %    FL/BPD:     74.4   %    71 - 87
 FL:       52.4  mm     G. Age:  27w 6d         49  %    FL/AC:      21.6   %    20 - 24
 LV:        4.3  mm

 Est. FW:    1216  gm    2 lb 11 oz      75  %     FW Discordancy     0 \ 12 %
Gestational Age (Fetus B)

 LMP:           51w 6d        Date:  12/01/19                 EDD:   09/06/20
 U/S Today:     28w 3d                                        EDD:   02/17/21
 Best:          27w 3d     Det. By:  Early Ultrasound         EDD:   02/24/21
                                     (08/01/20)
Anatomy (Fetus B)

 Cranium:               Appears normal         Aortic Arch:            Previously seen
 Cavum:                 Previously seen        Ductal Arch:            Previously seen
 Ventricles:            Appears normal         Diaphragm:              Previously seen
 Choroid Plexus:        Previously seen        Stomach:                Appears normal, left
                                                                       sided
 Cerebellum:            Previously seen        Abdomen:                Previously seen
 Posterior Fossa:       Previously seen        Abdominal Wall:         Previously seen
 Nuchal Fold:           Not applicable (>20    Cord Vessels:           Previously seen
                        wks GA)
 Face:                  Orbits and profile     Kidneys:                Appear normal
                        previously seen
 Lips:                  Previously seen        Bladder:                Appears normal
 Thoracic:              Previously seen        Spine:                  Previously seen
 Heart:                 Appears normal         Upper Extremities:      Previously seen
                        (4CH, axis, and
                        situs)
 RVOT:                  Previously seen        Lower Extremities:      Previously seen
 LVOT:                  Not well visualized
Cervix Uterus Adnexa

 Cervix
 Not visualized (advanced GA >45wks)

 Right Ovary
 Not visualized.

 Left Ovary
 Not visualized.
Comments

 This patient was seen for a follow up growth scan due to a
 monochorionic, diamniotic twin gestation.  She is receiving IV
 iron infusions.  She denies any problems since her last exam.
 She was informed that the fetal growth and amniotic fluid
 level appears appropriate for her gestational age for both twin
 A and twin B.
 There were no signs of the twin to twin transfusion syndrome
 noted today.
 She will return in 2 weeks for another ultrasound exam.
 She reports that she already has a cesarean delivery
 scheduled on February 05, 2021.

## 2022-12-28 ENCOUNTER — Encounter: Payer: Self-pay | Admitting: Family Medicine

## 2022-12-30 IMAGING — US US MFM OB FOLLOW-UP EACH ADDL GEST (MODIFY)
1 series · 12 of 28 positions shown · non-contrast
Comparison: none

[Series 1: us mfm ob follow-up each addl gest (modify) · 37 acquisitions, 12 frames shown]
[im 2/37]
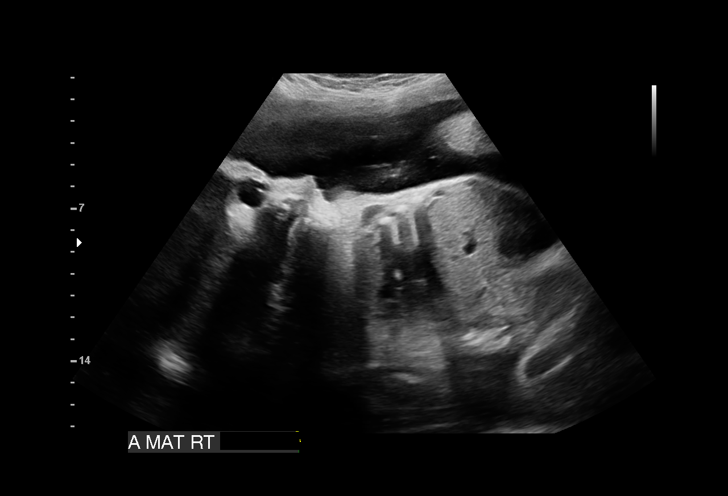
[im 5/37]
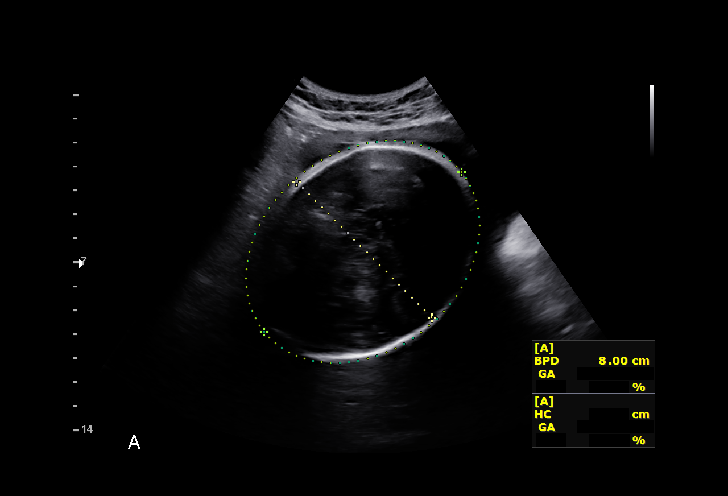
[im 7/37]
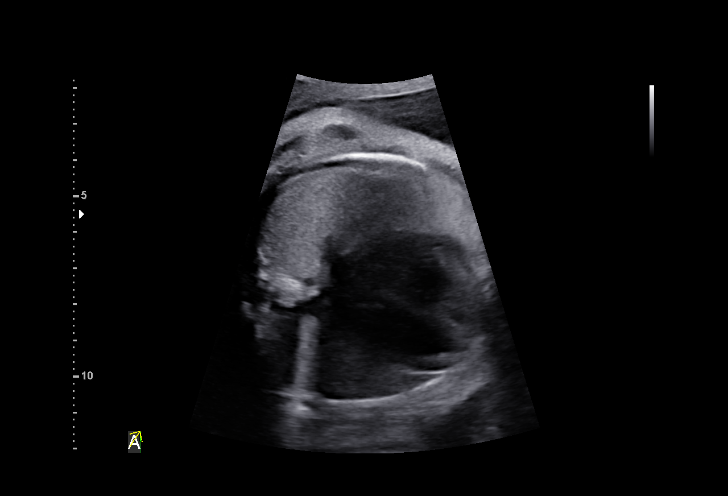
[im 11/37]
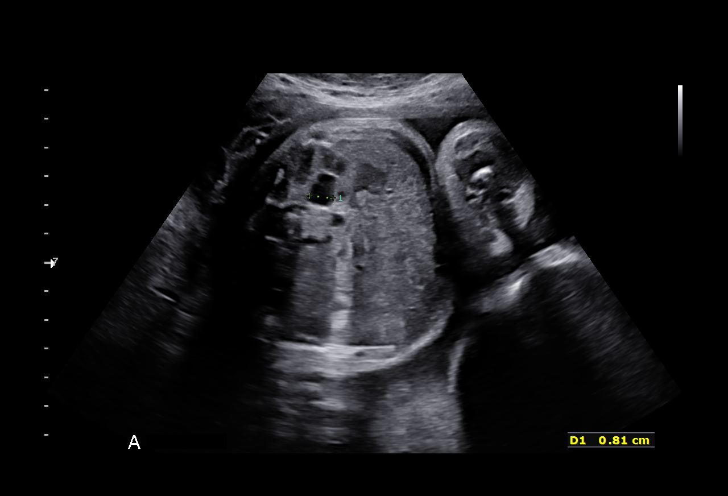
[im 14/37]
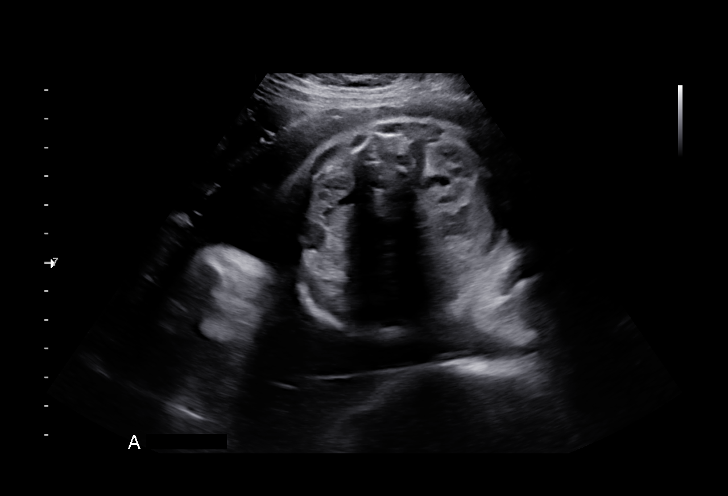
[im 17/37]
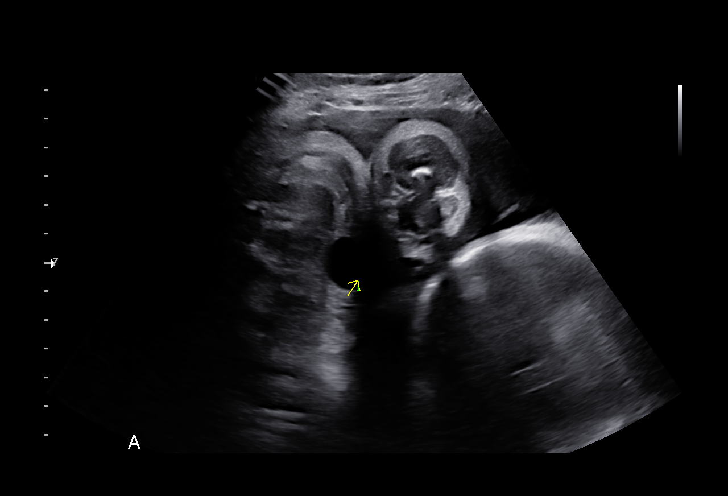
[im 21/37]
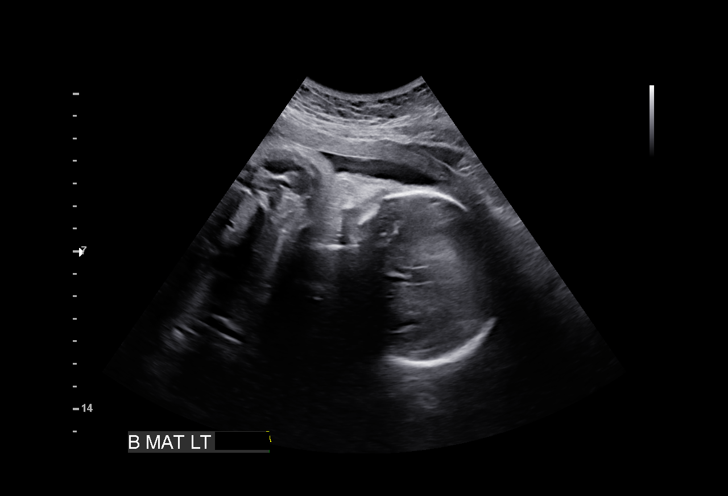
[im 23/37]
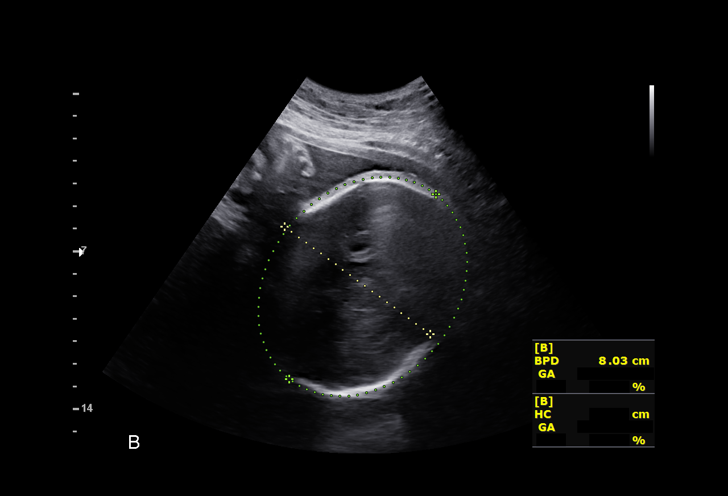
[im 26/37]
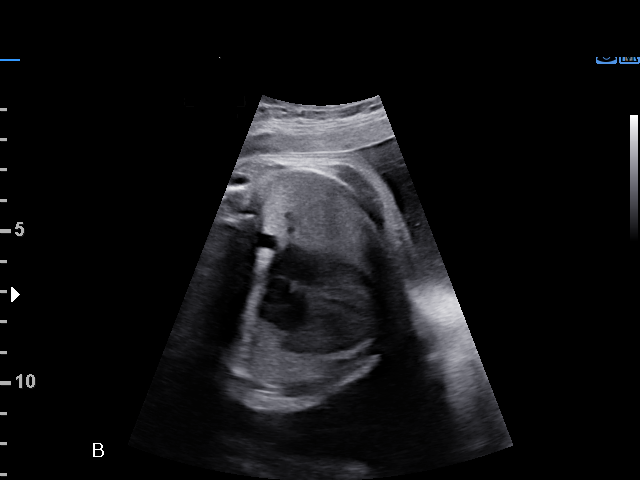
[im 30/37]
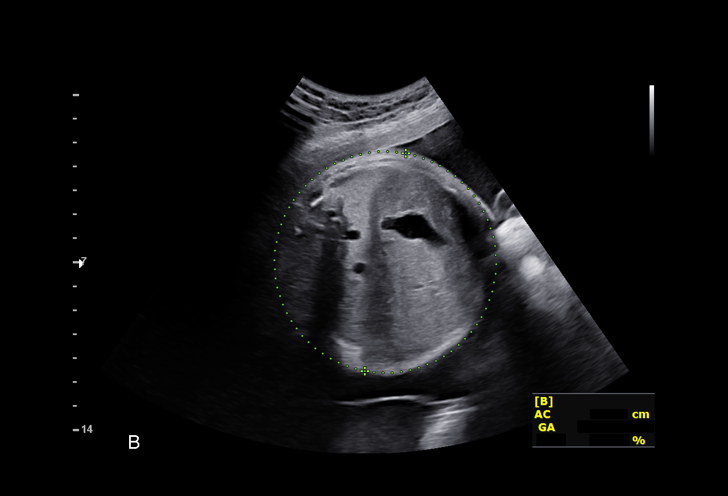
[im 33/37]
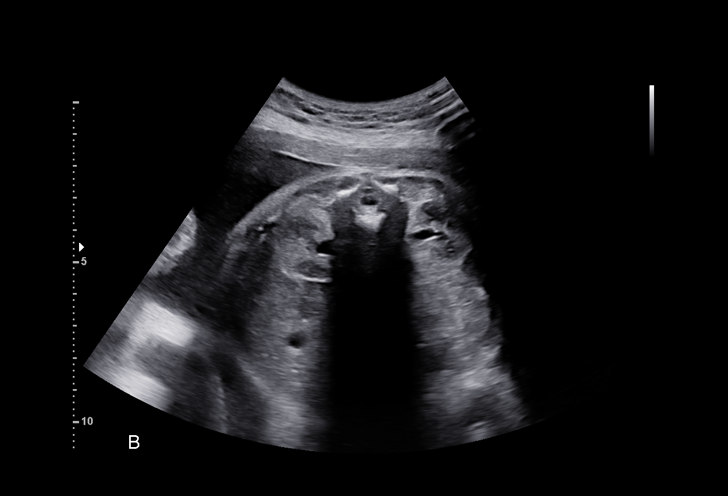
[im 35/37]
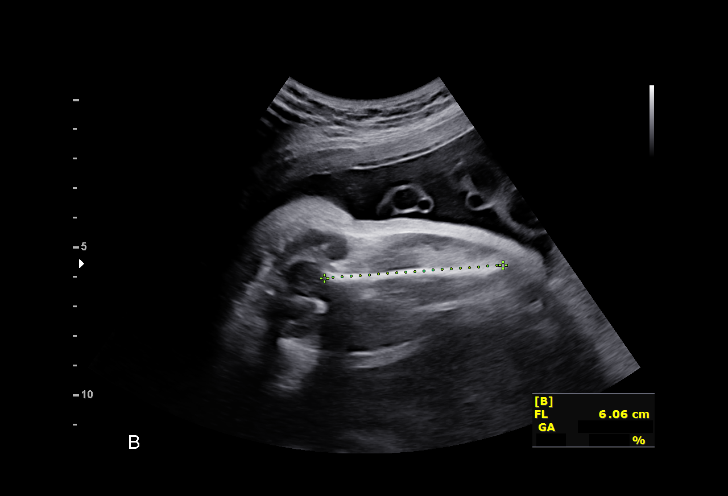

[12 of 28 positions shown; findings below may reference images not displayed]

GEST

Indications

 31 weeks gestation of pregnancy
 Twin pregnancy, Maksud/Nari, third trimester
 Previous pregnancy with congenital
 anomaly, antepartum (cystic hygroma)
 Encounter for other antenatal screening
 follow-up
 Normal Fetal ECHO x 2
 Neg QUAD
Fetal Evaluation (Fetus A)

 Num Of Fetuses:         2
 Fetal Heart Rate(bpm):  133
 Cardiac Activity:       Observed
 Fetal Lie:              Maternal right side
 Presentation:           Breech
 Placenta:               Posterior
 P. Cord Insertion:      Previously Visualized
 Membrane Desc:      Dividing Membrane seen - Monochorionic

 Amniotic Fluid
 AFI FV:      Within normal limits

                             Largest Pocket(cm)

Biometry (Fetus A)

 BPD:      79.8  mm     G. Age:  32w 0d         55  %    CI:        72.96   %    70 - 86
                                                         FL/HC:      19.9   %    19.1 -
 HC:       297   mm     G. Age:  32w 6d         48  %    HC/AC:      1.05        0.96 -
 AC:      283.9  mm     G. Age:  32w 3d         73  %    FL/BPD:     74.2   %    71 - 87
 FL:       59.2  mm     G. Age:  30w 6d         19  %    FL/AC:      20.9   %    20 - 24

 Est. FW:    4640  gm      4 lb 2 oz     52  %     FW Discordancy         6  %
OB History

 Gravidity:    2         Term:   0        Prem:   0        SAB:   0
 TOP:          1       Ectopic:  0        Living: 0
Gestational Age (Fetus A)

 LMP:           56w 0d        Date:  12/01/19                 EDD:   09/06/20
 U/S Today:     32w 0d                                        EDD:   02/21/21
 Best:          31w 4d     Det. By:  Early Ultrasound         EDD:   02/24/21
                                     (08/01/20)
Anatomy (Fetus A)

 Cranium:               Appears normal         Aortic Arch:            Previously seen
 Cavum:                 Previously seen        Ductal Arch:            Previously seen
 Ventricles:            Appears normal         Diaphragm:              Previously seen
 Choroid Plexus:        Previously seen        Stomach:                Appears normal, left
                                                                       sided
 Cerebellum:            Previously seen        Abdomen:                Previously seen
 Posterior Fossa:       Previously seen        Abdominal Wall:         Previously seen
 Nuchal Fold:           Not applicable (>20    Cord Vessels:           Previously seen
                        wks GA)
 Face:                  Orbits and profile     Kidneys:                Appear normal
                        previously seen
 Lips:                  Not well visualized    Bladder:                Appears normal
 Thoracic:              Previously seen        Spine:                  Previously seen
 Heart:                 Appears normal         Upper Extremities:      Previously seen
                        (4CH, axis, and
                        situs)
 RVOT:                  Previously seen        Lower Extremities:      Previously seen
 LVOT:                  Previously seen

Fetal Evaluation (Fetus B)

 Num Of Fetuses:         2
 Fetal Heart Rate(bpm):  155
 Cardiac Activity:       Observed
 Fetal Lie:              Maternal left side
 Presentation:           Cephalic
 Placenta:               Posterior
 P. Cord Insertion:      Previously Visualized
 Membrane Desc:      Dividing Membrane seen - Monochorionic

 Amniotic Fluid
 AFI FV:      Within normal limits

                             Largest Pocket(cm)

Biometry (Fetus B)

 BPD:      79.4  mm     G. Age:  31w 6d         49  %    CI:        71.01   %    70 - 86
                                                         FL/HC:      19.9   %    19.1 -
 HC:      300.2  mm     G. Age:  33w 2d         61  %    HC/AC:      1.03        0.96 -
 AC:       292   mm     G. Age:  33w 1d         89  %    FL/BPD:     75.3   %    71 - 87
 FL:       59.8  mm     G. Age:  31w 1d         25  %    FL/AC:      20.5   %    20 - 24

 Est. FW:    0310  gm      4 lb 6 oz     69  %     FW Discordancy      0 \ 6 %
Gestational Age (Fetus B)

 LMP:           56w 0d        Date:  12/01/19                 EDD:   09/06/20
 U/S Today:     32w 3d                                        EDD:   02/18/21
 Best:          31w 4d     Det. By:  Early Ultrasound         EDD:   02/24/21
                                     (08/01/20)
Anatomy (Fetus B)

 Cranium:               Appears normal         Aortic Arch:            Previously seen
 Cavum:                 Previously seen        Ductal Arch:            Previously seen
 Ventricles:            Appears normal         Diaphragm:              Previously seen
 Choroid Plexus:        Previously seen        Stomach:                Appears normal, left
                                                                       sided
 Cerebellum:            Previously seen        Abdomen:                Previously seen
 Posterior Fossa:       Previously seen        Abdominal Wall:         Previously seen
 Nuchal Fold:           Not applicable (>20    Cord Vessels:           Previously seen
                        wks GA)
 Face:                  Orbits and profile     Kidneys:                Appear normal
                        previously seen
 Lips:                  Previously seen        Bladder:                Appears normal
 Thoracic:              Previously seen        Spine:                  Previously seen
 Heart:                 Appears normal         Upper Extremities:      Previously seen
                        (4CH, axis, and
                        situs)
 RVOT:                  Previously seen        Lower Extremities:      Previously seen
 LVOT:                  Appears normal
Cervix Uterus Adnexa

 Cervix
 Not visualized (advanced GA >08wks)

 Right Ovary
 Not visualized.

 Left Ovary
 Not visualized.
Comments

 Ms. Escorcio was seen follow up growth with monochorionic,
 diamniotic twin gestation.
 She is overall doing well without complaints.
 Today positive interval growth was observed with twin
 discordance of 6%.
 Stomach, bladder and amniotic fluid level appears
 appropriate for her gestational age for both twin A and twin B.
 There were no signs of the twin to twin transfusion syndrome
 noted today.
 She will continue q 2 weeks TTTS screening with alternate
 growth.
 She reports that she already has a cesarean delivery
 scheduled on February 05, 2021.

## 2023-01-07 LAB — HEPATITIS C ANTIBODY: HCV Ab: NEGATIVE

## 2023-01-07 LAB — OB RESULTS CONSOLE GC/CHLAMYDIA
Chlamydia: NEGATIVE
Neisseria Gonorrhea: NEGATIVE

## 2023-01-07 LAB — OB RESULTS CONSOLE RPR: RPR: NONREACTIVE

## 2023-01-07 LAB — OB RESULTS CONSOLE ANTIBODY SCREEN: Antibody Screen: NEGATIVE

## 2023-01-07 LAB — OB RESULTS CONSOLE HIV ANTIBODY (ROUTINE TESTING): HIV: NONREACTIVE

## 2023-01-07 LAB — OB RESULTS CONSOLE HEPATITIS B SURFACE ANTIGEN: Hepatitis B Surface Ag: NEGATIVE

## 2023-01-07 LAB — OB RESULTS CONSOLE RUBELLA ANTIBODY, IGM: Rubella: IMMUNE

## 2023-04-08 ENCOUNTER — Other Ambulatory Visit: Payer: Self-pay | Admitting: Obstetrics & Gynecology

## 2023-04-08 DIAGNOSIS — O3692X0 Maternal care for fetal problem, unspecified, second trimester, not applicable or unspecified: Secondary | ICD-10-CM

## 2023-04-30 DIAGNOSIS — O09299 Supervision of pregnancy with other poor reproductive or obstetric history, unspecified trimester: Secondary | ICD-10-CM | POA: Insufficient documentation

## 2023-04-30 DIAGNOSIS — Z98891 History of uterine scar from previous surgery: Secondary | ICD-10-CM | POA: Insufficient documentation

## 2023-04-30 DIAGNOSIS — O09899 Supervision of other high risk pregnancies, unspecified trimester: Secondary | ICD-10-CM | POA: Insufficient documentation

## 2023-05-03 ENCOUNTER — Ambulatory Visit (HOSPITAL_BASED_OUTPATIENT_CLINIC_OR_DEPARTMENT_OTHER): Payer: Self-pay

## 2023-05-03 ENCOUNTER — Ambulatory Visit: Payer: Self-pay | Attending: Obstetrics and Gynecology | Admitting: Maternal & Fetal Medicine

## 2023-05-03 VITALS — BP 129/80 | HR 102

## 2023-05-03 DIAGNOSIS — Z363 Encounter for antenatal screening for malformations: Secondary | ICD-10-CM | POA: Diagnosis not present

## 2023-05-03 DIAGNOSIS — Z98891 History of uterine scar from previous surgery: Secondary | ICD-10-CM | POA: Diagnosis not present

## 2023-05-03 DIAGNOSIS — O34211 Maternal care for low transverse scar from previous cesarean delivery: Secondary | ICD-10-CM | POA: Diagnosis not present

## 2023-05-03 DIAGNOSIS — N858 Other specified noninflammatory disorders of uterus: Secondary | ICD-10-CM | POA: Insufficient documentation

## 2023-05-03 DIAGNOSIS — O09293 Supervision of pregnancy with other poor reproductive or obstetric history, third trimester: Secondary | ICD-10-CM | POA: Diagnosis not present

## 2023-05-03 DIAGNOSIS — O352XX0 Maternal care for (suspected) hereditary disease in fetus, not applicable or unspecified: Secondary | ICD-10-CM | POA: Insufficient documentation

## 2023-05-03 DIAGNOSIS — O358XX0 Maternal care for other (suspected) fetal abnormality and damage, not applicable or unspecified: Secondary | ICD-10-CM | POA: Diagnosis not present

## 2023-05-03 DIAGNOSIS — O34219 Maternal care for unspecified type scar from previous cesarean delivery: Secondary | ICD-10-CM | POA: Diagnosis not present

## 2023-05-03 DIAGNOSIS — Z3A29 29 weeks gestation of pregnancy: Secondary | ICD-10-CM

## 2023-05-03 DIAGNOSIS — O09213 Supervision of pregnancy with history of pre-term labor, third trimester: Secondary | ICD-10-CM | POA: Diagnosis not present

## 2023-05-03 DIAGNOSIS — O09899 Supervision of other high risk pregnancies, unspecified trimester: Secondary | ICD-10-CM | POA: Diagnosis not present

## 2023-05-03 DIAGNOSIS — O09212 Supervision of pregnancy with history of pre-term labor, second trimester: Secondary | ICD-10-CM | POA: Diagnosis not present

## 2023-05-03 DIAGNOSIS — O3692X0 Maternal care for fetal problem, unspecified, second trimester, not applicable or unspecified: Secondary | ICD-10-CM | POA: Diagnosis present

## 2023-05-03 NOTE — Progress Notes (Signed)
 Patient information  Patient Name: Jody Watson  Patient MRN:   [id number]  Referring practice: [facility] Referring Provider: Retta Caster - Briant Camper R. Ricky Charter, MD  MFM CONSULT  Jody Watson is a 28 y.o. [phone] at [redacted]w[redacted]d here for ultrasound and consultation. Patient Active Problem List   Diagnosis Date Noted   Hx of cesarean section 04/30/2023   Hx of preterm delivery, currently pregnant 04/30/2023   Hx of premature rupture of membranes in previous pregnancy, currently pregnant 04/30/2023   Anxiety disorder 12/30/2020   Jody Watson has a pregnancy with the complications mentioned in the problem list. During today's visit we focused on the following concerns:   RE prominent stomach: The stomach is apparent but appears within normal limits. Fluid can be visualized coming through the pylorus into the duodenum. The fluid level is normal which suggests there is no significant stenosis/blockage within the bowel.  I discussed that unless there is evidence on ultrasound of a fetal bowel abnormality or polyhydramnios then she does not need postnatal evaluation other than a normal physical newborn exam.  The patient has a history of preterm birth in the previous twin pregnancy.  She also had a cystic hygroma in her first pregnancy that resulted and a fetal loss.  Sonographic findings Single intrauterine pregnancy at 29w 5d  Fetal cardiac activity:  Observed and appears normal. Presentation: Variable. The anatomic structures that were well seen appear normal without evidence of soft markers.  The stomach appears normal with gastric fluid moving through the pyloric sphincter on color Doppler.  Due to poor acoustic windows some structures remain suboptimally visualized. Fetal biometry shows the estimated fetal weight at the 65 percentile.  Amniotic fluid: Within normal limits.  MVP: 4.13 cm. Placenta: Anterior. Adnexa: No adnexal mass visualized. Cervical length: 5 cm.  There are limitations of  prenatal ultrasound such as the inability to detect certain abnormalities due to poor visualization. Various factors such as fetal position, gestational age and maternal body habitus may increase the difficulty in visualizing the fetal anatomy.    Recommendations - Follow-up ultrasound every month with her OB provider to assess the stomach and potential GI complications or development of polyhydramnios.  If there is evidence of any other ultrasound abnormalities please refer back to MFM.  Review of Systems: A review of systems was performed and was negative except per HPI   Vitals and Physical Exam    05/03/2023    8:07 AM 05/01/2021    2:47 PM 01/01/2021    9:23 AM  Vitals with BMI  Height  5\' 3"    Weight  175 lbs 13 oz   BMI  31.15   Systolic 129 114 130  Diastolic 80 80 81  Pulse 102 81 90    Sitting comfortably on the sonogram table Nonlabored breathing Normal rate and rhythm Abdomen is nontender  Past pregnancies OB History  Gravida Para Term Preterm AB Living  3 1  1 1 2   SAB IAB Ectopic Multiple Live Births   1  1 2     # Outcome Date GA Lbr Len/2nd Weight Sex Type Anes PTL Lv  3 Current           2A Preterm 12/29/20 [redacted]w[redacted]d  4 lb 3 oz (1.9 kg) F CS-LTranv Spinal  LIV  2B Preterm 12/29/20 [redacted]w[redacted]d  4 lb 10.1 oz (2.1 kg) F CS-LTranv Spinal  LIV  1 IAB             I spent 30 minutes  reviewing the patients chart, including labs and images as well as counseling the patient about her medical conditions. Greater than 50% of the time was spent in direct face-to-face patient counseling.  Jody Bowling, DO Maternal fetal medicine,    05/03/2023  9:15 AM

## 2023-06-13 LAB — OB RESULTS CONSOLE GBS: GBS: NEGATIVE

## 2023-06-19 ENCOUNTER — Other Ambulatory Visit: Payer: Self-pay | Admitting: Obstetrics & Gynecology

## 2023-06-26 ENCOUNTER — Observation Stay (HOSPITAL_COMMUNITY)

## 2023-07-03 ENCOUNTER — Encounter (HOSPITAL_COMMUNITY): Payer: Self-pay

## 2023-07-03 ENCOUNTER — Telehealth (HOSPITAL_COMMUNITY): Payer: Self-pay | Admitting: *Deleted

## 2023-07-03 NOTE — Telephone Encounter (Signed)
 Preadmission screen

## 2023-07-03 NOTE — Patient Instructions (Signed)
 Jody Watson  07/03/2023   Your procedure is scheduled on:  7.13.2025  Arrive at 0800 at Entrance C on [address] at [facility]  and [address]. You are invited to use the FREE valet parking or use the Visitor's parking deck.  Pick up the phone at the desk and dial [phone].  Call this number if you have problems the morning of surgery: [phone]  Remember:   Do not eat food:(After Midnight) Desps de medianoche.  You may drink clear liquids until  __0600___.  Clear liquids means a liquid you can see thru.  It can have color such as Cola or Kool aid.  Tea is OK and coffee as long as no milk or creamer of any kind.  Take these medicines the morning of surgery with A SIP OF WATER:  none   Do not wear jewelry, make-up or nail polish.  Do not wear lotions, powders, or perfumes. Do not wear deodorant.  Do not shave 48 hours prior to surgery.  Do not bring valuables to the hospital.  [facility] is not   responsible for any belongings or valuables brought to the hospital.  Contacts, dentures or bridgework may not be worn into surgery.  Leave suitcase in the car. After surgery it may be brought to your room.  For patients admitted to the hospital, checkout time is 11:00 AM the day of              discharge.      Please read over the following fact sheets that you were given:     Preparing for Surgery

## 2023-07-07 ENCOUNTER — Inpatient Hospital Stay (HOSPITAL_COMMUNITY)
Admission: AD | Admit: 2023-07-07 | Discharge: 2023-07-07 | Disposition: A | Discharge status: Home / Self Care | Attending: Obstetrics and Gynecology | Admitting: Obstetrics and Gynecology

## 2023-07-07 ENCOUNTER — Other Ambulatory Visit: Payer: Self-pay

## 2023-07-07 ENCOUNTER — Inpatient Hospital Stay (HOSPITAL_BASED_OUTPATIENT_CLINIC_OR_DEPARTMENT_OTHER)

## 2023-07-07 DIAGNOSIS — O99413 Diseases of the circulatory system complicating pregnancy, third trimester: Secondary | ICD-10-CM | POA: Insufficient documentation

## 2023-07-07 DIAGNOSIS — O26893 Other specified pregnancy related conditions, third trimester: Secondary | ICD-10-CM

## 2023-07-07 DIAGNOSIS — Z3689 Encounter for other specified antenatal screening: Secondary | ICD-10-CM

## 2023-07-07 DIAGNOSIS — R0602 Shortness of breath: Secondary | ICD-10-CM

## 2023-07-07 DIAGNOSIS — E860 Dehydration: Secondary | ICD-10-CM

## 2023-07-07 DIAGNOSIS — R030 Elevated blood-pressure reading, without diagnosis of hypertension: Secondary | ICD-10-CM

## 2023-07-07 DIAGNOSIS — O99283 Endocrine, nutritional and metabolic diseases complicating pregnancy, third trimester: Secondary | ICD-10-CM | POA: Diagnosis not present

## 2023-07-07 DIAGNOSIS — O99891 Other specified diseases and conditions complicating pregnancy: Secondary | ICD-10-CM | POA: Diagnosis not present

## 2023-07-07 DIAGNOSIS — Z3A39 39 weeks gestation of pregnancy: Secondary | ICD-10-CM

## 2023-07-07 DIAGNOSIS — R000 Tachycardia, unspecified: Secondary | ICD-10-CM | POA: Diagnosis present

## 2023-07-07 DIAGNOSIS — I517 Cardiomegaly: Secondary | ICD-10-CM | POA: Diagnosis not present

## 2023-07-07 LAB — COMPREHENSIVE METABOLIC PANEL WITH GFR
ALT: 20 U/L (ref 0–44)
AST: 18 U/L (ref 15–41)
Albumin: 2.9 g/dL — ABNORMAL LOW (ref 3.5–5.0)
Alkaline Phosphatase: 125 U/L (ref 38–126)
Anion gap: 12 (ref 5–15)
BUN: 7 mg/dL (ref 6–20)
CO2: 19 mmol/L — ABNORMAL LOW (ref 22–32)
Calcium: 8.7 mg/dL — ABNORMAL LOW (ref 8.9–10.3)
Chloride: 105 mmol/L (ref 98–111)
Creatinine, Ser: 0.56 mg/dL (ref 0.44–1.00)
GFR, Estimated: >60 mL/min (ref >60)
Glucose, Bld: 99 mg/dL (ref 70–99)
Potassium: 3.6 mmol/L (ref 3.5–5.1)
Sodium: 136 mmol/L (ref 135–145)
Total Bilirubin: 0.4 mg/dL (ref 0.0–1.2)
Total Protein: 6.2 g/dL — ABNORMAL LOW (ref 6.5–8.1)

## 2023-07-07 LAB — CBC WITH DIFFERENTIAL/PLATELET
Abs Immature Granulocytes: 0.23 K/uL — ABNORMAL HIGH (ref 0.00–0.07)
Basophils Absolute: 0.1 K/uL (ref 0.0–0.1)
Basophils Relative: 0 %
Eosinophils Absolute: 0.1 K/uL (ref 0.0–0.5)
Eosinophils Relative: 1 %
HCT: 34.2 % — ABNORMAL LOW (ref 36.0–46.0)
Hemoglobin: 10.5 g/dL — ABNORMAL LOW (ref 12.0–15.0)
Immature Granulocytes: 2 %
Lymphocytes Relative: 11 %
Lymphs Abs: 1.8 K/uL (ref 0.7–4.0)
MCH: 21.5 pg — ABNORMAL LOW (ref 26.0–34.0)
MCHC: 30.7 g/dL (ref 30.0–36.0)
MCV: 70.1 fL — ABNORMAL LOW (ref 80.0–100.0)
Monocytes Absolute: 0.7 K/uL (ref 0.1–1.0)
Monocytes Relative: 5 %
Neutro Abs: 12.9 K/uL — ABNORMAL HIGH (ref 1.7–7.7)
Neutrophils Relative %: 81 %
Platelets: 389 K/uL (ref 150–400)
RBC: 4.88 MIL/uL (ref 3.87–5.11)
RDW: 18 % — ABNORMAL HIGH (ref 11.5–15.5)
WBC: 15.8 K/uL — ABNORMAL HIGH (ref 4.0–10.5)
nRBC: 0.4 % — ABNORMAL HIGH (ref 0.0–0.2)

## 2023-07-07 LAB — T4, FREE: Free T4: 0.78 ng/dL (ref 0.61–1.12)

## 2023-07-07 LAB — URINALYSIS, ROUTINE W REFLEX MICROSCOPIC
Bilirubin Urine: NEGATIVE
Glucose, UA: 150 mg/dL — AB
Hgb urine dipstick: NEGATIVE
Ketones, ur: 5 mg/dL — AB
Nitrite: NEGATIVE
Protein, ur: NEGATIVE mg/dL
Specific Gravity, Urine: 1.017 (ref 1.005–1.030)
pH: 6 (ref 5.0–8.0)

## 2023-07-07 LAB — ECHOCARDIOGRAM COMPLETE
S' Lateral: 2.6 cm
Weight: 3492 [oz_av]

## 2023-07-07 LAB — TSH: TSH: 2.853 u[IU]/mL (ref 0.350–4.500)

## 2023-07-07 MED ORDER — LACTATED RINGERS IV BOLUS
1000.0000 mL | Freq: Once | INTRAVENOUS | Status: AC
  Administered 2023-07-07: 1000 mL via INTRAVENOUS

## 2023-07-07 NOTE — Progress Notes (Signed)
 Echocardiogram 2D Echocardiogram has been performed.  Damien FALCON Kitiara Hintze RDCS 07/07/2023, 1:38 PM

## 2023-07-07 NOTE — MAU Provider Note (Signed)
 Tachycardia, Dizziness     S [name] is a 28 y.o. [phone] pregnant female at [redacted]w[redacted]d who presents to MAU today with complaint of elevated heart rate with symptomatic lightheadedness. Pt states for the last few days she has felt palpitations and associated lightheadedness.  Home pulse was 155bpm.  She states some intermittent nausea and SOB but no CP. Denies VB, LOF.  Endorses +FM.  She's noticed increased swelling but denies HA, RUQ pain, change in vision, or elevated Bps. Denies UTI symptoms, F/C.  States she is hot all the time but no other changes to hair / skin  / nails.  No thyromegaly appreciated but pt does reports hx of having to take some unknown thyroid  medication for a short while outside of pregnancy.   Receives care at [facility]. Prenatal records reviewed.  Pertinent items noted in HPI and remainder of comprehensive ROS otherwise negative.   O BP 124/85 (BP Location: Right Arm)   Pulse (!) 111   Temp 98.2 F (36.8 C) (Oral)   Resp 18   Wt 99 kg   LMP 09/28/2022   SpO2 99%   BMI 38.66 kg/m  Physical Exam Vitals and nursing note reviewed.  Constitutional:      General: She is not in acute distress.    Appearance: She is well-developed. She is obese. She is not ill-appearing.  HENT:     Head: Normocephalic and atraumatic.     Mouth/Throat:     Mouth: Mucous membranes are moist.     Pharynx: Oropharynx is clear.  Eyes:     Extraocular Movements: Extraocular movements intact.  Neck:     Thyroid : No thyroid  mass, thyromegaly or thyroid  tenderness.  Cardiovascular:     Rate and Rhythm: Regular rhythm. Tachycardia present.     Heart sounds: No murmur heard.    No friction rub. No gallop.  Pulmonary:     Effort: Pulmonary effort is normal. No respiratory distress.     Breath sounds: Normal breath sounds. No rhonchi.  Abdominal:     General: Bowel sounds are normal. There is no distension.     Palpations: Abdomen is soft.     Tenderness: There is no abdominal  tenderness.     Comments: gravid  Skin:    General: Skin is warm and dry.     Coloration: Skin is not pale.     Findings: No erythema or rash.  Neurological:     Mental Status: She is alert and oriented to person, place, and time.     Motor: No weakness.  Psychiatric:        Mood and Affect: Mood normal.        Behavior: Behavior normal.   EKG: sinus tachycardia. With Ventricular rate of 130s. Some changes in P wave morphology suggestive of ?MAT vs ?LAE.  NST: 145bpm, moderate variability, +accels, no decels, no ctx    MDM: MAU Course:  Given symptomatic tachycardia with ?LA enlargement on ECG will order echo while awaiting labs. Will also give 1L bolus since no concern for pulmonary edema.    UA glucose 150, noninfectious appearing   TTE = nrll EF, nrl LV function, mild LVH, limited visualization of RV, LA and RA normal size, trivial MV regurgitation,  limited view of AV but w/o obvious stenosis, IVC w/ >50% collapse with inspiration with RA pressure   Thyroid  labs   TSH 2.853, T4 0.78 CMP reassuring  Echo suggestive of dehydration and pt had some improvement with  pIVF bolus.  Spoke with Dr. Armond the covering provider who will arrange BP check in office tomorrow. Dr. Armond feels comfortable with d/c at this time given BP went down following pIVF, no proteinuria, no symptoms.  Baby remains breech but is agreeable to close follow up OP as she is hoping for baby to turn and chance at [facility].     AP #[redacted] weeks gestation #Symptomatic tachycardia #Dehydration #reactive NST #Elevated BP w/o dx of HTN - BP check tomorrow in clinic, pt states has appt tomorrow already - encouraged proper hydration  Discharge from MAU in stable condition with strict/usual precautions Follow up at [facility] as scheduled for ongoing prenatal care  Allergies as of 07/07/2023       Reactions   Bee Venom Anaphylaxis   Tape Dermatitis   Paper tape         Medication List     STOP  taking these medications    ibuprofen  600 MG tablet Commonly known as: ADVIL        TAKE these medications    oxyCODONE  5 MG immediate release tablet Commonly known as: Oxy IR/ROXICODONE  Take 1-2 tablets (5-10 mg total) by mouth every 6 (six) hours as needed for moderate pain.   prenatal multivitamin Tabs tablet Take 1 tablet by mouth daily at 12 noon.        Jhonny Augustin BROCKS, MD 07/07/2023 3:22 PM

## 2023-07-07 NOTE — MAU Note (Signed)
.  Jody Watson is a 28 y.o. at [redacted]w[redacted]d here in MAU reporting: feeling dizzy for the last few days.  Reports home pulse was 155 Denies vaginal bleeding Denies LOF Reports +FM Onset of complaint: COUPLE OF DAYS Pain score: 2/10 left lower lobe pain There were no vitals filed for this visit.    Lab orders placed from triage:   ua

## 2023-07-08 LAB — T3, FREE: T3, Free: 2.9 pg/mL (ref 2.0–4.4)

## 2023-07-09 LAB — THYROID ANTIBODIES (THYROPEROXIDASE & THYROGLOBULIN)
Thyroglobulin Antibody: <1 [IU]/mL (ref 0.0–0.9)
Thyroperoxidase Ab SerPl-aCnc: 10 [IU]/mL (ref 0–34)

## 2023-07-10 ENCOUNTER — Telehealth (HOSPITAL_COMMUNITY): Payer: Self-pay | Admitting: *Deleted

## 2023-07-10 NOTE — Telephone Encounter (Signed)
 Preadmission screen

## 2023-07-11 ENCOUNTER — Encounter (HOSPITAL_COMMUNITY): Payer: Self-pay

## 2023-07-12 ENCOUNTER — Encounter (HOSPITAL_COMMUNITY)
Admission: RE | Admit: 2023-07-12 | Discharge: 2023-07-12 | Disposition: A | Discharge status: Home / Self Care | Source: Ambulatory Visit | Attending: Obstetrics & Gynecology | Admitting: Obstetrics & Gynecology

## 2023-07-12 DIAGNOSIS — Z01812 Encounter for preprocedural laboratory examination: Secondary | ICD-10-CM | POA: Insufficient documentation

## 2023-07-12 LAB — TYPE AND SCREEN
ABO/RH(D): O POS
Antibody Screen: NEGATIVE

## 2023-07-12 LAB — CBC
HCT: 35 % — ABNORMAL LOW (ref 36.0–46.0)
Hemoglobin: 10.4 g/dL — ABNORMAL LOW (ref 12.0–15.0)
MCH: 21.2 pg — ABNORMAL LOW (ref 26.0–34.0)
MCHC: 29.7 g/dL — ABNORMAL LOW (ref 30.0–36.0)
MCV: 71.3 fL — ABNORMAL LOW (ref 80.0–100.0)
Platelets: 364 K/uL (ref 150–400)
RBC: 4.91 MIL/uL (ref 3.87–5.11)
RDW: 18.1 % — ABNORMAL HIGH (ref 11.5–15.5)
WBC: 17.6 K/uL — ABNORMAL HIGH (ref 4.0–10.5)
nRBC: 0.3 % — ABNORMAL HIGH (ref 0.0–0.2)

## 2023-07-13 LAB — RPR: RPR Ser Ql: NONREACTIVE

## 2023-07-13 NOTE — H&P (Signed)
 Jody Watson is Watson 28 y.o. female presenting for repeat C/section at 40 wks. Declines TL. PCOS, Metformin, stopped at 10 wks. EDC by sono 07/14/23 Married female. G3, P0112, LTCS x 1 for Mono-Di twin girls. PPROM, delivered at 31.6 wks- 12/29/20.. Prior to that- TOP for b/l cystic hygromas and thick NT.   Labs nl NIPT LR boy.  Anatomy sono - possible abnormal heart views- crux of the heart appears prominent - cleared by Fetal echo and stomach appeared Watson  bit large- cleared by MFM at f/up sono. Echogenic bowel resolved. Growth sono 35 wks - persistent transverse lie. anterior placenta, EFW 6'6 67% AC 94% .f/up sono Breech and f/up at 39 wks - oblique head, spine transverse. Pt declined version.   Stable anxiety Buspar  5 mg bid  . OB History     Gravida  3   Para  1   Term      Preterm  1   AB  1   Living  2      SAB      IAB  1   Ectopic      Multiple  1   Live Births  2          Past Medical History:  Diagnosis Date   Anemia    none recent   Anxiety    Asthma    no inhalers used, denies current problem   COVID jan 18th 2022   rapid home sob stomach pain n/v diarrhea x 2 weeks allsymptoms resoleved   Depression    Headache    PCOS (polycystic ovarian syndrome)    UTI (urinary tract infection)    Past Surgical History:  Procedure Laterality Date   CESAREAN SECTION     CESAREAN SECTION MULTI-GESTATIONAL N/Watson 12/29/2020   Procedure: Primary CESAREAN SECTION MULTI-GESTATIONAL;  Surgeon: Jody Ruby A, DO;  Location: [facility];  Service: Obstetrics;  Laterality: N/Watson;  EDD: 02/24/21   DILATION AND EVACUATION N/Watson 03/11/2020   Procedure: DILATATION AND EVACUATION;  Surgeon: Jody Knock, MD;  Location: [facility];  Service: Gynecology;  Laterality: N/Watson;   OPERATIVE ULTRASOUND N/Watson 03/11/2020   Procedure: OPERATIVE ULTRASOUND;  Surgeon: Jody Knock, MD;  Location: [facility];  Service: Gynecology;  Laterality: N/Watson;    TONSILLECTOMY  as child   wisdome teeth extraction  age 46   Family History: family history includes Alcohol abuse in her maternal grandfather; Asthma in her father; Diabetes in her father and paternal grandfather; Fibromyalgia in her mother; Heart disease in her maternal grandfather and paternal grandfather. Social History:  reports that she has never smoked. She has never used smokeless tobacco. She reports that she does not currently use alcohol. She reports that she does not use drugs.     Maternal Diabetes: No Genetic Screening: Normal Maternal Ultrasounds/Referrals: Other: suspected fetal anomaly - Nl per fetal echo, suspected large fetal stomach and echogenic bowel at 19 wks, Nl per MFM sono at f/up.  Fetal Ultrasounds or other Referrals:  Fetal echo, Referred to Materal Fetal Medicine  Maternal Substance Abuse:  No Significant Maternal Medications:  Meds include: Other: Buspar  5 mg bid Significant Maternal Lab Results:  Group B Strep negative Number of Prenatal Visits:greater than 3 verified prenatal visits Maternal Vaccinations:declined. TDAP in 2022 Other Comments:  None  Review of Systems History    Exam Physical Exam  BP 124/87   Pulse (!) 110   Temp 97.9 F (36.6 C) (Oral)   Resp  17   Ht 5' 3 (1.6 m)   Wt 99.3 kg   LMP 09/28/2022   SpO2 100%   Breastfeeding Yes   BMI 38.79 kg/m   Physical exam:  Watson&O x 3, no acute distress. Pleasant HEENT neg, no thyromegaly Lungs CTA bilat CV RRR, S1S2 normal Abdo soft, non tender, non acute Extr no edema/ tenderness Pelvic def\ Baby is cephalic unengaged presentation.  FHT  140s Toco none  Prenatal labs: ABO, Rh: --/--/O POS (07/11 9090) Antibody: NEG (07/11 0909) Rubella: Immune (01/06 0000) RPR: Nonreactive (01/06 0000)  HBsAg: Negative (01/06 0000)  Hep C neg  HIV: Non-reactive (01/06 0000)  GBS: Negative/-- (06/12 0000)  Glucola nl NIPT LR AFP1 neg   Assessment/Plan: 28 yo [id number] at 40 wks for repeat  c-section for transverse lie. Baby is cephalic today but unengaged. She does not want the risk of emergency c-section and would prefer repeat elective.  Declines TL Risks/complications of surgery reviewed incl infection, bleeding, damage to internal organs including bladder, bowels, ureters, blood vessels, other risks from anesthesia, VTE and delayed complications of any surgery, complications in future surgery reviewed. Also discussed neonatal complications incl difficult delivery, laceration, vacuum assistance, TTN etc. Pt understands and agrees, all concerns addressed.      Jody Watson Render MD

## 2023-07-14 ENCOUNTER — Inpatient Hospital Stay (HOSPITAL_COMMUNITY): Payer: Self-pay | Admitting: Anesthesiology

## 2023-07-14 ENCOUNTER — Other Ambulatory Visit: Payer: Self-pay

## 2023-07-14 ENCOUNTER — Encounter (HOSPITAL_COMMUNITY): Payer: Self-pay | Admitting: Obstetrics & Gynecology

## 2023-07-14 ENCOUNTER — Encounter (HOSPITAL_COMMUNITY)
Admission: AD | Disposition: A | Discharge status: Home / Self Care | Payer: Self-pay | Source: Home / Self Care | Attending: Obstetrics & Gynecology

## 2023-07-14 ENCOUNTER — Inpatient Hospital Stay (HOSPITAL_COMMUNITY)
Admission: AD | Admit: 2023-07-14 | Discharge: 2023-07-16 | DRG: 788 | Disposition: A | Discharge status: Home / Self Care | Attending: Obstetrics & Gynecology | Admitting: Obstetrics & Gynecology

## 2023-07-14 DIAGNOSIS — O322XX0 Maternal care for transverse and oblique lie, not applicable or unspecified: Secondary | ICD-10-CM | POA: Diagnosis present

## 2023-07-14 DIAGNOSIS — Z79899 Other long term (current) drug therapy: Secondary | ICD-10-CM

## 2023-07-14 DIAGNOSIS — Z3A40 40 weeks gestation of pregnancy: Secondary | ICD-10-CM

## 2023-07-14 DIAGNOSIS — Z8249 Family history of ischemic heart disease and other diseases of the circulatory system: Secondary | ICD-10-CM

## 2023-07-14 DIAGNOSIS — Z833 Family history of diabetes mellitus: Secondary | ICD-10-CM

## 2023-07-14 DIAGNOSIS — O34211 Maternal care for low transverse scar from previous cesarean delivery: Secondary | ICD-10-CM | POA: Diagnosis present

## 2023-07-14 DIAGNOSIS — O34219 Maternal care for unspecified type scar from previous cesarean delivery: Secondary | ICD-10-CM | POA: Diagnosis present

## 2023-07-14 SURGERY — Surgical Case
Anesthesia: Spinal

## 2023-07-14 MED ORDER — DIBUCAINE (PERIANAL) 1 % EX OINT: 1.0000 | TOPICAL_OINTMENT | CUTANEOUS | Status: DC | PRN

## 2023-07-14 MED ORDER — PHENYLEPHRINE HCL-NACL 20-0.9 MG/250ML-% IV SOLN
INTRAVENOUS | Status: AC
Start: 2023-07-14 — End: 2023-07-14
  Filled 2023-07-14: qty 250

## 2023-07-14 MED ORDER — KETOROLAC TROMETHAMINE 30 MG/ML IJ SOLN
30.0000 mg | Freq: Four times a day (QID) | INTRAMUSCULAR | Status: AC
  Administered 2023-07-14 – 2023-07-15 (×4): 30 mg via INTRAVENOUS
  Filled 2023-07-14 (×4): qty 1

## 2023-07-14 MED ORDER — LACTATED RINGERS IV SOLN: INTRAVENOUS | Status: DC

## 2023-07-14 MED ORDER — IBUPROFEN 600 MG PO TABS
600.0000 mg | ORAL_TABLET | Freq: Four times a day (QID) | ORAL | Status: DC
  Administered 2023-07-15 – 2023-07-16 (×4): 600 mg via ORAL
  Filled 2023-07-14 (×4): qty 1

## 2023-07-14 MED ORDER — OXYTOCIN-SODIUM CHLORIDE 30-0.9 UT/500ML-% IV SOLN: 2.5000 [IU]/h | INTRAVENOUS | Status: AC

## 2023-07-14 MED ORDER — DIPHENHYDRAMINE HCL 25 MG PO CAPS: 25.0000 mg | ORAL_CAPSULE | Freq: Four times a day (QID) | ORAL | Status: DC | PRN

## 2023-07-14 MED ORDER — FENTANYL CITRATE (PF) 100 MCG/2ML IJ SOLN: 25.0000 ug | INTRAMUSCULAR | Status: DC | PRN

## 2023-07-14 MED ORDER — WITCH HAZEL-GLYCERIN EX PADS: 1.0000 | MEDICATED_PAD | CUTANEOUS | Status: DC | PRN

## 2023-07-14 MED ORDER — MEPERIDINE HCL 25 MG/ML IJ SOLN: 6.2500 mg | INTRAMUSCULAR | Status: DC | PRN

## 2023-07-14 MED ORDER — BUPIVACAINE IN DEXTROSE 0.75-8.25 % IT SOLN
INTRATHECAL | Status: DC | PRN
  Administered 2023-07-14: 1.6 mL via INTRATHECAL

## 2023-07-14 MED ORDER — ONDANSETRON HCL 4 MG/2ML IJ SOLN
INTRAMUSCULAR | Status: AC
Start: 2023-07-14 — End: 2023-07-14
  Filled 2023-07-14: qty 2

## 2023-07-14 MED ORDER — SCOPOLAMINE 1 MG/3DAYS TD PT72
MEDICATED_PATCH | TRANSDERMAL | Status: DC | PRN
  Administered 2023-07-14: 1 via TRANSDERMAL

## 2023-07-14 MED ORDER — COCONUT OIL OIL: 1.0000 | TOPICAL_OIL | Status: DC | PRN

## 2023-07-14 MED ORDER — ONDANSETRON HCL 4 MG/2ML IJ SOLN
INTRAMUSCULAR | Status: DC | PRN
  Administered 2023-07-14: 4 mg via INTRAVENOUS

## 2023-07-14 MED ORDER — METOCLOPRAMIDE HCL 5 MG/ML IJ SOLN
INTRAMUSCULAR | Status: AC
Start: 2023-07-14 — End: 2023-07-14
  Filled 2023-07-14: qty 2

## 2023-07-14 MED ORDER — ONDANSETRON HCL 4 MG/2ML IJ SOLN: 4.0000 mg | Freq: Once | INTRAMUSCULAR | Status: DC | PRN

## 2023-07-14 MED ORDER — SODIUM CHLORIDE 0.9 % IR SOLN
Status: DC | PRN
  Administered 2023-07-14: 1

## 2023-07-14 MED ORDER — OXYTOCIN-SODIUM CHLORIDE 30-0.9 UT/500ML-% IV SOLN
INTRAVENOUS | Status: AC
  Filled 2023-07-14: qty 500

## 2023-07-14 MED ORDER — OXYTOCIN-SODIUM CHLORIDE 30-0.9 UT/500ML-% IV SOLN
INTRAVENOUS | Status: DC | PRN
  Administered 2023-07-14: 300 mL via INTRAVENOUS

## 2023-07-14 MED ORDER — PHENYLEPHRINE 80 MCG/ML (10ML) SYRINGE FOR IV PUSH (FOR BLOOD PRESSURE SUPPORT)
PREFILLED_SYRINGE | INTRAVENOUS | Status: AC
Start: 2023-07-14 — End: 2023-07-14
  Filled 2023-07-14: qty 10

## 2023-07-14 MED ORDER — ACETAMINOPHEN 500 MG PO TABS
1000.0000 mg | ORAL_TABLET | Freq: Four times a day (QID) | ORAL | Status: DC
  Administered 2023-07-14 – 2023-07-16 (×7): 1000 mg via ORAL
  Filled 2023-07-14 (×8): qty 2

## 2023-07-14 MED ORDER — CEFAZOLIN SODIUM-DEXTROSE 2-4 GM/100ML-% IV SOLN
2.0000 g | INTRAVENOUS | Status: AC
  Administered 2023-07-14: 2 g via INTRAVENOUS

## 2023-07-14 MED ORDER — MORPHINE SULFATE (PF) 0.5 MG/ML IJ SOLN
INTRAMUSCULAR | Status: AC
  Filled 2023-07-14: qty 10

## 2023-07-14 MED ORDER — MORPHINE SULFATE (PF) 0.5 MG/ML IJ SOLN
INTRAMUSCULAR | Status: DC | PRN
  Administered 2023-07-14: 150 ug via INTRATHECAL

## 2023-07-14 MED ORDER — STERILE WATER FOR IRRIGATION IR SOLN
Status: DC | PRN
  Administered 2023-07-14: 1

## 2023-07-14 MED ORDER — ACETAMINOPHEN 10 MG/ML IV SOLN
INTRAVENOUS | Status: DC | PRN
  Administered 2023-07-14: 1000 mg via INTRAVENOUS

## 2023-07-14 MED ORDER — PHENYLEPHRINE HCL-NACL 20-0.9 MG/250ML-% IV SOLN
INTRAVENOUS | Status: DC | PRN
  Administered 2023-07-14: 60 ug/min via INTRAVENOUS

## 2023-07-14 MED ORDER — SENNOSIDES-DOCUSATE SODIUM 8.6-50 MG PO TABS
2.0000 | ORAL_TABLET | ORAL | Status: DC
  Administered 2023-07-14 – 2023-07-16 (×3): 2 via ORAL
  Filled 2023-07-14 (×3): qty 2

## 2023-07-14 MED ORDER — CEFAZOLIN SODIUM-DEXTROSE 2-4 GM/100ML-% IV SOLN
INTRAVENOUS | Status: AC
Start: 2023-07-14 — End: 2023-07-14
  Filled 2023-07-14: qty 100

## 2023-07-14 MED ORDER — POVIDONE-IODINE 10 % EX SWAB
2.0000 | Freq: Once | CUTANEOUS | Status: AC
  Administered 2023-07-14: 2 via TOPICAL

## 2023-07-14 MED ORDER — OXYCODONE HCL 5 MG/5ML PO SOLN: 5.0000 mg | Freq: Once | ORAL | Status: DC | PRN

## 2023-07-14 MED ORDER — OXYCODONE HCL 5 MG PO TABS
5.0000 mg | ORAL_TABLET | ORAL | Status: DC | PRN
  Administered 2023-07-15: 10 mg via ORAL
  Administered 2023-07-15 (×2): 5 mg via ORAL
  Administered 2023-07-16: 10 mg via ORAL
  Administered 2023-07-16: 5 mg via ORAL
  Filled 2023-07-14 (×3): qty 1
  Filled 2023-07-14 (×2): qty 2

## 2023-07-14 MED ORDER — ACETAMINOPHEN 10 MG/ML IV SOLN
INTRAVENOUS | Status: AC
  Filled 2023-07-14: qty 100

## 2023-07-14 MED ORDER — FENTANYL CITRATE (PF) 100 MCG/2ML IJ SOLN
INTRAMUSCULAR | Status: AC
  Filled 2023-07-14: qty 2

## 2023-07-14 MED ORDER — FENTANYL CITRATE (PF) 100 MCG/2ML IJ SOLN
INTRAMUSCULAR | Status: DC | PRN
  Administered 2023-07-14: 15 ug via INTRATHECAL

## 2023-07-14 MED ORDER — DEXAMETHASONE SODIUM PHOSPHATE 4 MG/ML IJ SOLN
INTRAMUSCULAR | Status: AC
Start: 2023-07-14 — End: 2023-07-14
  Filled 2023-07-14: qty 2

## 2023-07-14 MED ORDER — PRENATAL MULTIVITAMIN CH
1.0000 | ORAL_TABLET | Freq: Every day | ORAL | Status: DC
  Administered 2023-07-14 – 2023-07-16 (×3): 1 via ORAL
  Filled 2023-07-14 (×3): qty 1

## 2023-07-14 MED ORDER — OXYCODONE HCL 5 MG PO TABS: 5.0000 mg | ORAL_TABLET | Freq: Once | ORAL | Status: DC | PRN

## 2023-07-14 MED ORDER — MENTHOL 3 MG MT LOZG: 1.0000 | LOZENGE | OROMUCOSAL | Status: DC | PRN

## 2023-07-14 MED ORDER — SIMETHICONE 80 MG PO CHEW: 80.0000 mg | CHEWABLE_TABLET | ORAL | Status: DC | PRN

## 2023-07-14 MED ORDER — DEXAMETHASONE SODIUM PHOSPHATE 10 MG/ML IJ SOLN
INTRAMUSCULAR | Status: DC | PRN
  Administered 2023-07-14: 10 mg via INTRAVENOUS

## 2023-07-14 MED ORDER — SIMETHICONE 80 MG PO CHEW
80.0000 mg | CHEWABLE_TABLET | Freq: Three times a day (TID) | ORAL | Status: DC
  Administered 2023-07-14 – 2023-07-16 (×6): 80 mg via ORAL
  Filled 2023-07-14 (×6): qty 1

## 2023-07-14 MED ORDER — DEXMEDETOMIDINE HCL IN NACL 80 MCG/20ML IV SOLN
INTRAVENOUS | Status: DC | PRN
  Administered 2023-07-14 (×3): 4 ug via INTRAVENOUS
  Administered 2023-07-14: 8 ug via INTRAVENOUS

## 2023-07-14 MED ORDER — ZOLPIDEM TARTRATE 5 MG PO TABS
5.0000 mg | ORAL_TABLET | Freq: Every evening | ORAL | Status: DC | PRN
Start: 2023-07-14 — End: 2023-07-16

## 2023-07-14 MED ORDER — SCOPOLAMINE 1 MG/3DAYS TD PT72
MEDICATED_PATCH | TRANSDERMAL | Status: AC
  Filled 2023-07-14: qty 1

## 2023-07-14 MED ORDER — TETANUS-DIPHTH-ACELL PERTUSSIS 5-2.5-18.5 LF-MCG/0.5 IM SUSY: 0.5000 mL | PREFILLED_SYRINGE | Freq: Once | INTRAMUSCULAR | Status: DC

## 2023-07-14 SURGICAL SUPPLY — 32 items
BENZOIN TINCTURE PRP APPL 2/3 (GAUZE/BANDAGES/DRESSINGS) IMPLANT
CHLORAPREP W/TINT 26 (MISCELLANEOUS) ×2 IMPLANT
CLAMP UMBILICAL CORD (MISCELLANEOUS) ×1 IMPLANT
CLOTH BEACON ORANGE TIMEOUT ST (SAFETY) ×1 IMPLANT
DERMABOND ADVANCED .7 DNX12 (GAUZE/BANDAGES/DRESSINGS) IMPLANT
DRSG OPSITE POSTOP 4X10 (GAUZE/BANDAGES/DRESSINGS) ×1 IMPLANT
ELECTRODE REM PT RTRN 9FT ADLT (ELECTROSURGICAL) ×1 IMPLANT
EXTRACTOR VACUUM KIWI (MISCELLANEOUS) IMPLANT
EXTRACTOR VACUUM M CUP 4 TUBE (SUCTIONS) IMPLANT
GLOVE BIO SURGEON STRL SZ7 (GLOVE) ×1 IMPLANT
GLOVE BIOGEL PI IND STRL 7.0 (GLOVE) ×1 IMPLANT
GOWN STRL REUS W/TWL LRG LVL3 (GOWN DISPOSABLE) ×2 IMPLANT
KIT ABG SYR 3ML LUER SLIP (SYRINGE) IMPLANT
MAT PREVALON FULL STRYKER (MISCELLANEOUS) IMPLANT
NDL HYPO 25X5/8 SAFETYGLIDE (NEEDLE) IMPLANT
NEEDLE HYPO 25X5/8 SAFETYGLIDE (NEEDLE) IMPLANT
NS IRRIG 1000ML POUR BTL (IV SOLUTION) ×1 IMPLANT
PACK C SECTION WH (CUSTOM PROCEDURE TRAY) ×1 IMPLANT
PAD OB MATERNITY 4.3X12.25 (PERSONAL CARE ITEMS) ×1 IMPLANT
RTRCTR C-SECT PINK 25CM LRG (MISCELLANEOUS) IMPLANT
STRIP CLOSURE SKIN 1/2X4 (GAUZE/BANDAGES/DRESSINGS) IMPLANT
SUT MNCRL 0 VIOLET CTX 36 (SUTURE) ×2 IMPLANT
SUT PLAIN 0 NONE (SUTURE) IMPLANT
SUT PLAIN ABS 2-0 CT1 27XMFL (SUTURE) IMPLANT
SUT VIC AB 0 CT1 27XBRD ANBCTR (SUTURE) ×2 IMPLANT
SUT VIC AB 2-0 CT1 TAPERPNT 27 (SUTURE) ×1 IMPLANT
SUT VIC AB 2-0 SH 27XBRD (SUTURE) ×2 IMPLANT
SUT VIC AB 4-0 KS 27 (SUTURE) ×1 IMPLANT
SUT VICRYL 0 TIES 12 18 (SUTURE) IMPLANT
TOWEL OR 17X24 6PK STRL BLUE (TOWEL DISPOSABLE) ×1 IMPLANT
TRAY FOLEY W/BAG SLVR 14FR LF (SET/KITS/TRAYS/PACK) IMPLANT
WATER STERILE IRR 1000ML POUR (IV SOLUTION) ×1 IMPLANT

## 2023-07-14 NOTE — Anesthesia Postprocedure Evaluation (Signed)
 Anesthesia Post Note  Patient: Jody Watson  Procedure(s) Performed: CESAREAN DELIVERY     Patient location during evaluation: PACU Anesthesia Type: Spinal Level of consciousness: oriented and awake and alert Pain management: pain level controlled Vital Signs Assessment: post-procedure vital signs reviewed and stable Respiratory status: spontaneous breathing, respiratory function stable and patient connected to nasal cannula oxygen Cardiovascular status: blood pressure returned to baseline and stable Postop Assessment: no headache, no backache and no apparent nausea or vomiting Anesthetic complications: no   No notable events documented.  Last Vitals:  Vitals:   07/14/23 1419 07/14/23 1536  BP: 126/78   Pulse: (!) 104   Resp: 19   Temp: 36.7 C 36.4 C  SpO2: 99% 98%    Last Pain:  Vitals:   07/14/23 1619  TempSrc:   PainSc: 0-No pain   Pain Goal:                   Blaise Palladino

## 2023-07-14 NOTE — Anesthesia Procedure Notes (Signed)
 Spinal  Patient location during procedure: OR Start time: 07/14/2023 9:45 AM End time: 07/14/2023 9:50 AM Reason for block: surgical anesthesia Staffing Anesthesiologist: Mallory Manus, MD Performed by: Mallory Manus, MD Authorized by: Mallory Manus, MD   Preanesthetic Checklist Completed: patient identified, IV checked, site marked, risks and benefits discussed, surgical consent, monitors and equipment checked, pre-op evaluation and timeout performed Spinal Block Patient position: sitting Prep: DuraPrep Patient monitoring: heart rate, cardiac monitor, continuous pulse ox and blood pressure Approach: midline Location: L5-S1 Injection technique: single-shot Needle Needle type: Sprotte  Needle gauge: 24 G Needle length: 9 cm Assessment Sensory level: T4 Events: CSF return

## 2023-07-14 NOTE — Addendum Note (Signed)
 Addendum  created 07/14/23 1751 by Mallory Manus, MD   Clinical Note Signed

## 2023-07-14 NOTE — Transfer of Care (Signed)
 Immediate Anesthesia Transfer of Care Note  Patient: Jody Watson  Procedure(s) Performed: CESAREAN DELIVERY  Patient Location: PACU  Anesthesia Type:Spinal  Level of Consciousness: awake, alert , and oriented  Airway & Oxygen Therapy: Patient Spontanous Breathing  Post-op Assessment: Report given to RN and Post -op Vital signs reviewed and stable  Post vital signs: Reviewed and stable  Last Vitals:  Vitals Value Taken Time  BP 130/82 07/14/23 11:15  Temp    Pulse 85 07/14/23 11:16  Resp 21 07/14/23 11:16  SpO2 97 % 07/14/23 11:16  Vitals shown include unfiled device data.  Last Pain:  Vitals:   07/14/23 0843  TempSrc: Oral  PainSc: 0-No pain         Complications: No notable events documented.

## 2023-07-14 NOTE — Brief Op Note (Signed)
 07/14/2023  10:47 AM  PATIENT:  Jody Watson  28 y.o. female  PRE-OPERATIVE DIAGNOSIS: Prior cesarean section, desires repeat.  Floating/unengaged vertex.  Variable lie.  Currently vertex.  POST-OPERATIVE DIAGNOSIS:  Prior cesarean section, desires repeat.  Floating/unengaged vertex.  Variable lie.  Currently vertex.  PROCEDURE:  Procedure(s) with comments: CESAREAN DELIVERY (N/A) - EDD 07/14/23 Repeat cesarean section with single layer closure  SURGEON:  Surgeons and Role:    * Barbette Knock, MD - Primary    * Kandyce Sor, MD - Assisting  PHYSICIAN ASSISTANT:   ASSISTANTS: As above  ANESTHESIA:   spinal  EBL: Per anesthesia notes  BLOOD ADMINISTERED:none  DRAINS: Urinary Catheter (Foley)   LOCAL MEDICATIONS USED:  NONE  SPECIMEN:  No Specimen  DISPOSITION OF SPECIMEN:  N/A  COUNTS:  YES  TOURNIQUET:  * No tourniquets in log *  DICTATION: .Note written in EPIC  PLAN OF CARE: Admit to inpatient   PATIENT DISPOSITION:  PACU - hemodynamically stable.   Delay start of Pharmacological VTE agent (>24hrs) due to surgical blood loss or risk of bleeding: yes

## 2023-07-14 NOTE — Anesthesia Preprocedure Evaluation (Signed)
 Anesthesia Evaluation  Patient identified by MRN, date of birth, ID band Patient awake    Reviewed: Allergy & Precautions, H&P , NPO status , Patient's Chart, lab work & pertinent test results, reviewed documented beta blocker date and time   Airway Mallampati: II  TM Distance: >3 FB Neck ROM: full    Dental no notable dental hx.    Pulmonary neg pulmonary ROS   Pulmonary exam normal breath sounds clear to auscultation       Cardiovascular negative cardio ROS Normal cardiovascular exam Rhythm:regular Rate:Normal     Neuro/Psych negative neurological ROS  negative psych ROS   GI/Hepatic negative GI ROS, Neg liver ROS,,,  Endo/Other  negative endocrine ROS    Renal/GU negative Renal ROS  negative genitourinary   Musculoskeletal   Abdominal   Peds  Hematology  (+) Blood dyscrasia, anemia   Anesthesia Other Findings   Reproductive/Obstetrics (+) Pregnancy                              Anesthesia Physical Anesthesia Plan  ASA: 2  Anesthesia Plan: Spinal   Post-op Pain Management: Minimal or no pain anticipated   Induction: Intravenous  PONV Risk Score and Plan: 2  Airway Management Planned:   Additional Equipment: None  Intra-op Plan:   Post-operative Plan:   Informed Consent: I have reviewed the patients History and Physical, chart, labs and discussed the procedure including the risks, benefits and alternatives for the proposed anesthesia with the patient or authorized representative who has indicated his/her understanding and acceptance.       Plan Discussed with: Anesthesiologist and CRNA  Anesthesia Plan Comments: (  )        Anesthesia Quick Evaluation

## 2023-07-14 NOTE — Op Note (Addendum)
 07/14/2023  10:47 AM  PATIENT:  Jody Watson  28 y.o. female  PRE-OPERATIVE DIAGNOSIS: Prior cesarean section, desires repeat.  Floating/unengaged vertex.  Variable lie.  Currently vertex.  POST-OPERATIVE DIAGNOSIS:  Prior cesarean section, desires repeat.  Floating/unengaged vertex.  Variable lie.  Currently vertex.  PROCEDURE:  Procedure(s) with comments: CESAREAN DELIVERY (N/A) - EDD 07/14/23 Repeat cesarean section with single layer closure  SURGEON:  Surgeons and Role:    * Barbette Knock, MD - Primary    * Kandyce Sor, MD - Assisting  PHYSICIAN ASSISTANT:   ASSISTANTS: As above  ANESTHESIA:   spinal  EBL: 281cc  BLOOD ADMINISTERED:none  DRAINS: Urinary Catheter (Foley)   LOCAL MEDICATIONS USED:  NONE  SPECIMEN:  No Specimen  DISPOSITION OF SPECIMEN:  N/A  COUNTS:  YES  TOURNIQUET:  * No tourniquets in log *  DICTATION: .Note written in EPIC  PLAN OF CARE: Admit to inpatient   PATIENT DISPOSITION:  PACU - hemodynamically stable.   Delay start of Pharmacological VTE agent (>24hrs) due to surgical blood loss or risk of bleeding: yes    Findings:  @BABYSEXEBC @ infant,  APGAR (1 MIN): 8  APGAR (5 MINS): 9  APGAR (10 MINS):   Normal uterus, tubes and ovaries, normal placenta. 3VC, clear amniotic fluid  EBL: 281 IVF: 2L IVF Urine out 300cc Antibiotics:   2g Ancef  Complications: none  Indications: This is a 28 y.o. year-old, G 3 P 01 1 2 at [redacted]w[redacted]d admitted for repeat cesarean section. Risks benefits and alternatives of the procedure were discussed with the patient who agreed to proceed  Procedure:  After informed consent was obtained the patient was taken to the operating room where spinal anesthesia was initiated.  She was prepped and draped in the normal sterile fashion in dorsal supine position with a leftward tilt.  A foley catheter was placed A Pfannenstiel skin incision was made 2 cm above the pubic symphysis in the midline with the scalpel, over  the old Pfannenstiel scar.  Dissection was carried down with the Bovie cautery until the fascia was reached. The fascia was incised in the midline. The incision was extended laterally with the Mayo scissors. The inferior aspect of the fascial incision was grasped with the Coker clamps, elevated up and the underlying rectus muscles were dissected off sharply. The superior aspect of the fascial incision was grasped with the Coker clamps elevated up and the underlying rectus muscles were dissected off sharply.  The peritoneum was entered bluntly. The peritoneal incision was extended superiorly and inferiorly with good visualization of the bladder.  The Alexis self-retaining retractor was placed.  Palpation was done to assess the fetal position and the location of the uterine vessels. The lower segment of the uterus was incised sharply with the scalpel and extended  bluntly in the cephalo-caudal fashion. The infant was grasped, brought to the incision, but the uterine incision was felt to be too small.  Using a bandage scissor the uterine incision was extended superiorly at the left and right angles.  Repeat attempt to bring the vertex to the incision but the head did not deliver.  A vacuum was placed on the fetal vertex which quickly aided in delivery of the vertex.  The vacuum was released and the infant was delivered without complication.  The nose and mouth were bulb suctioned. The cord was clamped and cut after 1 minute delay. The infant was handed off to the waiting pediatrician. The placenta was expressed. The uterus was cleared  of all clots and debris. The uterine incision was repaired with 0 Vicryl in a running locked fashion.  The tubes and ovaries were evaluated and found to be normal.  The uterine incision was reinspected and found to be hemostatic. The peritoneum was grasped and closed with 2-0 Vicryl in a running fashion. The cut muscle edges and the underside of the fascia were inspected and found to be  hemostatic. The fascia was closed with 0 Vicryl in a 2 halves. The subcutaneous tissue was irrigated. Scarpa's layer was closed with a 2-0 plain gut suture. The skin was closed with a 4-0 Monocryl on a Keith needle. the patient tolerated the procedure well. Sponge lap and needle counts were correct x3 and patient was taken to the recovery room in a stable condition.  Burnard DELENA Bowers dictating for Robbi Render, MD 07/14/2023 10:49 AM

## 2023-07-15 ENCOUNTER — Encounter (HOSPITAL_COMMUNITY): Payer: Self-pay | Admitting: Obstetrics & Gynecology

## 2023-07-15 LAB — CBC
HCT: 27.5 % — ABNORMAL LOW (ref 36.0–46.0)
Hemoglobin: 8 g/dL — ABNORMAL LOW (ref 12.0–15.0)
MCH: 20.5 pg — ABNORMAL LOW (ref 26.0–34.0)
MCHC: 29.1 g/dL — ABNORMAL LOW (ref 30.0–36.0)
MCV: 70.5 fL — ABNORMAL LOW (ref 80.0–100.0)
Platelets: 317 K/uL (ref 150–400)
RBC: 3.9 MIL/uL (ref 3.87–5.11)
RDW: 17.9 % — ABNORMAL HIGH (ref 11.5–15.5)
WBC: 22.8 K/uL — ABNORMAL HIGH (ref 4.0–10.5)
nRBC: 0.1 % (ref 0.0–0.2)

## 2023-07-15 MED ORDER — MAGNESIUM OXIDE -MG SUPPLEMENT 400 (240 MG) MG PO TABS
400.0000 mg | ORAL_TABLET | Freq: Every day | ORAL | Status: DC
  Administered 2023-07-15 – 2023-07-16 (×2): 400 mg via ORAL
  Filled 2023-07-15 (×2): qty 1

## 2023-07-15 MED ORDER — POLYSACCHARIDE IRON COMPLEX 150 MG PO CAPS
150.0000 mg | ORAL_CAPSULE | Freq: Every day | ORAL | Status: DC
  Administered 2023-07-15 – 2023-07-16 (×2): 150 mg via ORAL
  Filled 2023-07-15 (×2): qty 1

## 2023-07-15 NOTE — Social Work (Signed)
 MOB was referred for history of depression/anxiety.  * Referral screened out by Clinical Social Worker because none of the following criteria appear to apply:  ~ History of anxiety/depression during this pregnancy, or of post-partum depression following prior delivery.  ~ Diagnosis of anxiety and/or depression within last 3 years OR * MOB's symptoms currently being treated with medication and/or therapy. Per OB records MOB is currently prescribed and taking Buspar 5mg .   Please contact the Clinical Social Worker if needs arise, by [facility] request, or if MOB scores greater than 9/yes to question 10 on Edinburgh Postpartum Depression Screen.  Lashay Osborne, LCSWA Clinical Social Worker [phone]

## 2023-07-15 NOTE — Lactation Note (Signed)
 This note was copied from a baby's chart. Lactation Consultation Note  Patient Name: Jody Watson Date: 07/15/2023 Age:28 hours Reason for consult: Initial assessment;Term   Second pregnancy- first pregnancy 31 wk twins/ NICU, 40 wks, @ 21 hrs of life.  Mom pumped and bottle fed with twins/ over supply, mastitis issues. Discussed minimal pump use if possible this time and probiotics use to keep good bacteria elevated. Per mom baby cluster feeding with longer feeds. Mom receptive to latching baby- starts with hand expression, holds nice compression through feeding. Baby latches well without assist, audible swallows frequently. Couplet doing wonderfully together.  In case of early discharge- Encouraged mom to keep working on big mouth latch with baby and use EBM or coconut oil- provided- after each feed. Discussed cluster feeding overnight/ early morning brings in our milk supply, shared expectations of milk coming in. Highlighted risk of engorgement. Discussed hand pump/express to soften breasts, motrin  as anti-inflammatory, and ice packs for 10-20 minutes post feed/pumping if still over-full is the best treatments for inflamed/engorged breasts. Mom has hand pump @ home- highlighted best option to soften engorged breast and limit over supply issues.  Maternal Data Has patient been taught Hand Expression?: Yes Does the patient have breastfeeding experience prior to this delivery?: Yes How long did the patient breastfeed?: 31 wk twins did not come to breast well, pumped and fed for 9 months  Feeding Mother's Current Feeding Choice: Breast Milk  LATCH Score Latch: Grasps breast easily, tongue down, lips flanged, rhythmical sucking.  Audible Swallowing: Spontaneous and intermittent  Type of Nipple: Everted at rest and after stimulation  Comfort (Breast/Nipple): Soft / non-tender  Hold (Positioning): No assistance needed to correctly position infant at breast.  LATCH Score:  10   Lactation Tools Discussed/Used Tools: Coconut oil  Interventions Interventions: Breast feeding basics reviewed;Hand express;Breast compression;Coconut oil;Education;LC Services brochure;CDC milk storage guidelines  Discharge Pump: DEBP;Manual;Hands Free;Personal  Consult Status Consult Status: Follow-up Date: 07/15/23 Follow-up type: In-patient    Farris Blash 07/15/2023, 8:14 AM

## 2023-07-15 NOTE — Progress Notes (Signed)
 Subjective: Postpartum Day 1: Repeat LT Cesarean Delivery, 40 wks unstable lie  Patient reports tolerating PO, + flatus, and no problems voiding.  Pain well controlled.   Objective: Vital signs in last 24 hours: Temp:  [97.6 F (36.4 C)-99 F (37.2 C)] 98.6 F (37 C) (07/14 0232) Pulse Rate:  [81-104] 82 (07/14 0232) Resp:  [17-19] 19 (07/14 0232) BP: (114-126)/(71-91) 123/89 (07/14 0232) SpO2:  [97 %-100 %] 100 % (07/14 0232)  BP 123/89 (BP Location: Right Arm)   Pulse 82   Temp 98.6 F (37 C) (Oral)   Resp 19   Ht 5' 3 (1.6 m)   Wt 99.3 kg   LMP 09/28/2022   SpO2 100%   Breastfeeding Yes   BMI 38.79 kg/m   Physical Exam:  General: alert, cooperative, and appears stated age Lungs CTA CV RRR  Lochia: appropriate Uterine Fundus: firm  NABS, soft abdomen Incision: healing well, no significant drainage, dressing dry  DVT Evaluation: No evidence of DVT seen on physical exam. Negative Homan's sign.     Latest Ref Rng & Units 07/15/2023    4:34 AM 07/12/2023    9:04 AM 07/07/2023   12:35 PM  CBC  WBC 4.0 - 10.5 K/uL 22.8  17.6  15.8   Hemoglobin 12.0 - 15.0 g/dL 8.0  89.5  89.4   Hematocrit 36.0 - 46.0 % 27.5  35.0  34.2   Platelets 150 - 400 K/uL 317  364  389   Rh pos Rub Imm   Assessment/Plan: Status post Cesarean section. Doing well postoperatively.  Continue current care. POD #1  RC/s  Boy- s/p circumcision.  Breast feeding LC to assist  Post op care/ warning ss dw pt Anticipate D/c tomorrow    Robbi JONELLE Render, MD 07/15/2023, 11:33 AM

## 2023-07-16 MED ORDER — IBUPROFEN 600 MG PO TABS: 600.0000 mg | ORAL_TABLET | Freq: Four times a day (QID) | ORAL | 0 refills | Status: AC

## 2023-07-16 MED ORDER — OXYCODONE HCL 5 MG PO TABS: 5.0000 mg | ORAL_TABLET | ORAL | 0 refills | Status: AC | PRN

## 2023-07-16 MED ORDER — SENNOSIDES-DOCUSATE SODIUM 8.6-50 MG PO TABS
2.0000 | ORAL_TABLET | ORAL | 0 refills | Status: AC
Start: 2023-07-17 — End: ?

## 2023-07-16 MED ORDER — ACETAMINOPHEN 500 MG PO TABS: 1000.0000 mg | ORAL_TABLET | Freq: Four times a day (QID) | ORAL | 0 refills | Status: AC

## 2023-07-16 NOTE — Discharge Summary (Signed)
 Postpartum Discharge Summary  Date of Service updated 7/15.      Patient Name: Jody Watson DOB: [birth year] MRN: [id number]  Date of admission: 07/14/2023 Delivery date:07/14/2023 Delivering provider: MODY, VAISHALI Date of discharge: 07/16/2023  Admitting diagnosis: Cesarean delivery, delivered, current hospitalization [O82] Intrauterine pregnancy: [redacted]w[redacted]d     Secondary diagnosis:  Principal Problem:   Cesarean delivery, delivered, current hospitalization Active Problems:   Previous cesarean delivery, antepartum condition or complication  Additional problems: Anxiety.     Discharge diagnosis: Anemia                                              Post partum procedures:n/a.  Augmentation: N/A Complications: None  Hospital course: Sceduled C/S   28 y.o. yo [id number] at [redacted]w[redacted]d was admitted to the hospital 07/14/2023 for scheduled cesarean section with the following indication:Malpresentation.Delivery details are as follows:  Membrane Rupture Time/Date: 10:19 AM,07/14/2023  Delivery Method:C-Section, Low Transverse Operative Delivery:N/A Details of operation can be found in separate operative note.  Patient had a postpartum course complicated by acute anemia of pregnancy.  She is ambulating, tolerating a regular diet, passing flatus, and urinating well. Patient is discharged home in stable condition on  07/16/23        Newborn Data: Birth date:07/14/2023 Birth time:10:21 AM Gender:Female Living status:Living Apgars:8 ,9  Weight:3950 g    Magnesium  Sulfate received: No BMZ received: No Rhophylac:No MMR:No T-DaP:Given prenatally Flu: Yes RSV Vaccine received: No Transfusion:No Immunizations administered: Immunization History  Administered Date(s) Administered   DTaP 08/06/1995, 10/08/1995, 12/05/1995, 12/04/1996, 07/28/1999   HIB (PRP-OMP) 08/06/1995, 10/08/1995, 12/05/1995, 12/04/1996   HPV Quadrivalent 01/17/2013, 03/18/2013, 07/23/2013   Hepatitis A 07/18/2011   Hepatitis B  11-05-1995, 08/06/1995, 03/05/1996   IPV 08/06/1995, 10/08/1995, 06/05/1996, 07/28/1999   Influenza-Unspecified 10/10/2009, 12/16/2010, 10/27/2011, 10/13/2012   MMR 06/05/1996, 06/05/2000, 12/31/2020   Meningococcal Conjugate 10/10/2009   Td 08/08/2006   Tdap 08/08/2006   Varicella 06/05/1996, 10/10/2009    Physical exam  Vitals:   07/15/23 0232 07/15/23 1615 07/15/23 2040 07/16/23 0515  BP: 123/89 119/83 116/76 124/84  Pulse: 82 (!) 108 96 100  Resp: 19 18 16 16   Temp: 98.6 F (37 C) 98.4 F (36.9 C) 97.9 F (36.6 C) 98 F (36.7 C)  TempSrc: Oral Oral Oral Oral  SpO2: 100% 100% 100% 100%  Weight:      Height:       General: alert, cooperative, and no distress Lochia: appropriate Uterine Fundus: firm Incision: Dressing is clean, dry, and intact DVT Evaluation: No evidence of DVT seen on physical exam. Labs: Lab Results  Component Value Date   WBC 22.8 (H) 07/15/2023   HGB 8.0 (L) 07/15/2023   HCT 27.5 (L) 07/15/2023   MCV 70.5 (L) 07/15/2023   PLT 317 07/15/2023      Latest Ref Rng & Units 07/07/2023   12:35 PM  CMP  Glucose 70 - 99 mg/dL 99   BUN 6 - 20 mg/dL 7   Creatinine 9.55 - 8.99 mg/dL 9.43   Sodium 864 - 854 mmol/L 136   Potassium 3.5 - 5.1 mmol/L 3.6   Chloride 98 - 111 mmol/L 105   CO2 22 - 32 mmol/L 19   Calcium  8.9 - 10.3 mg/dL 8.7   Total Protein 6.5 - 8.1 g/dL 6.2   Total Bilirubin 0.0 - 1.2 mg/dL 0.4  Alkaline Phos 38 - 126 U/L 125   AST 15 - 41 U/L 18   ALT 0 - 44 U/L 20    Edinburgh Score:    07/15/2023    6:17 PM  Edinburgh Postnatal Depression Scale Screening Tool  I have been able to laugh and see the funny side of things. 0  I have looked forward with enjoyment to things. 0  I have blamed myself unnecessarily when things went wrong. 0  I have been anxious or worried for no good reason. 0  I have felt scared or panicky for no good reason. 0  Things have been getting on top of me. 0  I have been so unhappy that I have had  difficulty sleeping. 0  I have felt sad or miserable. 0  I have been so unhappy that I have been crying. 0  The thought of harming myself has occurred to me. 0  Edinburgh Postnatal Depression Scale Total 0      After visit meds:  Allergies as of 07/16/2023       Reactions   Bee Venom Anaphylaxis   Tape Dermatitis   Paper tape         Medication List     TAKE these medications    acetaminophen  500 MG tablet Commonly known as: TYLENOL  Take 2 tablets (1,000 mg total) by mouth every 6 (six) hours.   ferrous sulfate 325 (65 FE) MG tablet Take 325 mg by mouth every other day.   ibuprofen  600 MG tablet Commonly known as: ADVIL  Take 1 tablet (600 mg total) by mouth every 6 (six) hours.   oxyCODONE  5 MG immediate release tablet Commonly known as: Oxy IR/ROXICODONE  Take 1-2 tablets (5-10 mg total) by mouth every 4 (four) hours as needed for moderate pain (pain score 4-6).   prenatal multivitamin Tabs tablet Take 1 tablet by mouth daily at 12 noon.   senna-docusate 8.6-50 MG tablet Commonly known as: Senokot-S Take 2 tablets by mouth daily. Start taking on: July 17, 2023         Discharge home in stable condition Infant Feeding: Breast Infant Disposition:home with mother Discharge instruction: per After Visit Summary and Postpartum booklet. Activity: Advance as tolerated. Pelvic rest for 6 weeks.  Diet: routine diet Anticipated Birth Control: Unsure Postpartum Appointment:6 weeks Additional Postpartum F/U: Postpartum Depression checkup and Incision check as needed.  Future Appointments:No future appointments.   Follow-up Information     Barbette Knock, MD Follow up in 6 week(s).   Specialty: Obstetrics and Gynecology Contact information: [address] [place] [address] [phone]                     07/16/2023 Sherrilee KANDICE Both, CNM

## 2023-07-16 NOTE — Progress Notes (Addendum)
   Subjective: POD# 2 Live born female , circ complete.  Birth Weight: 8 lb 11.3 oz (3950 g) APGAR: 8, 9  Newborn Delivery   Birth date/time: 07/14/2023 10:21:00 Delivery type: C-Section, Low Transverse Trial of labor: No C-section categorization: Repeat    Baby name: Callum.  Delivering provider: MODY, VAISHALI  Circumcision complete.  Feeding: breast.  Pain control at delivery: Spinal  Reports feeling well overall, sharp shooting vaginal pain with standing. Relieved by position changes.  Patient reports tolerating PO.   Pain controlled with acetaminophen , ibuprofen  (OTC), and narcotic analgesics including oxycodone .  Denies HA/SOB/C/P/N/V/dizziness. She reports vaginal bleeding as normal, without clots.  Reporting burning with urination today. Shooting SP pain with movement and ketones and leukocytes on UA upon admission, post-operative indwelling catheter removed. Suspect ascending UTI from catheter.  Antenatal anemia, has been taking PO iron . Denies SOB, palpitations or dizziness.  She is ambulating and urinating without difficulty.     Objective:   Vitals:   07/15/23 0232 07/15/23 1615 07/15/23 2040 07/16/23 0515  BP: 123/89 119/83 116/76 124/84  Pulse: 82 (!) 108 96 100  Resp: 19 18 16 16   Temp: 98.6 F (37 C) 98.4 F (36.9 C) 97.9 F (36.6 C) 98 F (36.7 C)  TempSrc: Oral Oral Oral Oral  SpO2: 100% 100% 100% 100%  Weight:      Height:        No intake or output data in the 24 hours ending 07/16/23 0950      Recent Labs    07/15/23 0434  WBC 22.8*  HGB 8.0*  HCT 27.5*  PLT 317     Blood type: --/--/O POS (07/11 9090)  Rubella: Immune (01/06 0000)   Physical Exam:  General: alert and cooperative. CV: Regular rate and rhythm or S1S2 present. Resp: clear. Abdomen: soft, nontender, normal bowel sounds. Non-distended.  Incision: clean, dry, and intact. Uterine Fundus: firm, below umbilicus, nontender. Lochia: minimal. Ext: extremities normal,  atraumatic, no cyanosis or edema.  Assessment/Plan: 28 y.o.   POD# 2. [id number]                  Principal Problem:   Cesarean delivery, delivered, current hospitalization Active Problems:   Previous cesarean delivery, antepartum condition or complication  Anemia of pregnancy, non-significant. To continue PO iron  at home.  Encourage rest when baby rests. Breastfeeding w/o complaint.  Reviewed signs/concerns of UTI for indwelling catheter. Urine culture sent, to follow-up upon discharge. Counseled to call in symptoms worsen upon discharge for empiric antibiotics if indicated.  Encourage to ambulate, high fiber diet and hydration to encourage ambulation.  Anticipate discharge today. Pptm warning signs given.   Sherrilee KANDICE Both, CNM, MSN 07/16/2023, 9:50 AM

## 2023-07-16 NOTE — Discharge Instructions (Signed)

## 2023-07-17 ENCOUNTER — Inpatient Hospital Stay (HOSPITAL_COMMUNITY)
Admission: AD | Admit: 2023-07-17 | Discharge: 2023-07-19 | DRG: 776 | Disposition: A | Discharge status: Home / Self Care | Attending: Obstetrics and Gynecology | Admitting: Obstetrics and Gynecology

## 2023-07-17 ENCOUNTER — Encounter (HOSPITAL_COMMUNITY): Payer: Self-pay | Admitting: Obstetrics and Gynecology

## 2023-07-17 ENCOUNTER — Other Ambulatory Visit: Payer: Self-pay

## 2023-07-17 DIAGNOSIS — O9089 Other complications of the puerperium, not elsewhere classified: Secondary | ICD-10-CM

## 2023-07-17 DIAGNOSIS — O1415 Severe pre-eclampsia, complicating the puerperium: Secondary | ICD-10-CM | POA: Diagnosis not present

## 2023-07-17 DIAGNOSIS — Z8249 Family history of ischemic heart disease and other diseases of the circulatory system: Secondary | ICD-10-CM

## 2023-07-17 DIAGNOSIS — O9081 Anemia of the puerperium: Secondary | ICD-10-CM | POA: Diagnosis present

## 2023-07-17 DIAGNOSIS — R519 Headache, unspecified: Secondary | ICD-10-CM

## 2023-07-17 DIAGNOSIS — Z833 Family history of diabetes mellitus: Secondary | ICD-10-CM

## 2023-07-17 LAB — CBC
HCT: 29.9 % — ABNORMAL LOW (ref 36.0–46.0)
Hemoglobin: 8.7 g/dL — ABNORMAL LOW (ref 12.0–15.0)
MCH: 20.4 pg — ABNORMAL LOW (ref 26.0–34.0)
MCHC: 29.1 g/dL — ABNORMAL LOW (ref 30.0–36.0)
MCV: 70.2 fL — ABNORMAL LOW (ref 80.0–100.0)
Platelets: 394 K/uL (ref 150–400)
RBC: 4.26 MIL/uL (ref 3.87–5.11)
RDW: 18.2 % — ABNORMAL HIGH (ref 11.5–15.5)
WBC: 14.1 K/uL — ABNORMAL HIGH (ref 4.0–10.5)
nRBC: 0.4 % — ABNORMAL HIGH (ref 0.0–0.2)

## 2023-07-17 LAB — COMPREHENSIVE METABOLIC PANEL WITH GFR
ALT: 62 U/L — ABNORMAL HIGH (ref 0–44)
AST: 49 U/L — ABNORMAL HIGH (ref 15–41)
Albumin: 2.5 g/dL — ABNORMAL LOW (ref 3.5–5.0)
Alkaline Phosphatase: 93 U/L (ref 38–126)
Anion gap: 8 (ref 5–15)
BUN: 8 mg/dL (ref 6–20)
CO2: 24 mmol/L (ref 22–32)
Calcium: 8.7 mg/dL — ABNORMAL LOW (ref 8.9–10.3)
Chloride: 108 mmol/L (ref 98–111)
Creatinine, Ser: 0.46 mg/dL (ref 0.44–1.00)
GFR, Estimated: >60 mL/min (ref >60)
Glucose, Bld: 89 mg/dL (ref 70–99)
Potassium: 4.5 mmol/L (ref 3.5–5.1)
Sodium: 140 mmol/L (ref 135–145)
Total Bilirubin: 0.4 mg/dL (ref 0.0–1.2)
Total Protein: 5.5 g/dL — ABNORMAL LOW (ref 6.5–8.1)

## 2023-07-17 MED ORDER — LACTATED RINGERS IV SOLN: INTRAVENOUS | Status: AC

## 2023-07-17 MED ORDER — LABETALOL HCL 5 MG/ML IV SOLN: 40.0000 mg | INTRAVENOUS | Status: DC | PRN

## 2023-07-17 MED ORDER — HYDRALAZINE HCL 20 MG/ML IJ SOLN: 10.0000 mg | INTRAMUSCULAR | Status: DC | PRN

## 2023-07-17 MED ORDER — LABETALOL HCL 5 MG/ML IV SOLN: 20.0000 mg | INTRAVENOUS | Status: DC | PRN

## 2023-07-17 MED ORDER — CYCLOBENZAPRINE HCL 5 MG PO TABS
5.0000 mg | ORAL_TABLET | Freq: Once | ORAL | Status: AC
  Administered 2023-07-17: 5 mg via ORAL
  Filled 2023-07-17: qty 1

## 2023-07-17 MED ORDER — MAGNESIUM SULFATE BOLUS VIA INFUSION
4.0000 g | Freq: Once | INTRAVENOUS | Status: AC
  Administered 2023-07-17: 4 g via INTRAVENOUS
  Filled 2023-07-17: qty 1000

## 2023-07-17 MED ORDER — MAGNESIUM SULFATE 40 GM/1000ML IV SOLN
2.0000 g/h | INTRAVENOUS | Status: AC
  Administered 2023-07-18 (×2): 2 g/h via INTRAVENOUS
  Filled 2023-07-17 (×2): qty 1000

## 2023-07-17 MED ORDER — IBUPROFEN 800 MG PO TABS
400.0000 mg | ORAL_TABLET | Freq: Once | ORAL | Status: AC
  Administered 2023-07-17: 400 mg via ORAL
  Filled 2023-07-17: qty 1

## 2023-07-17 MED ORDER — LABETALOL HCL 5 MG/ML IV SOLN: 80.0000 mg | INTRAVENOUS | Status: DC | PRN

## 2023-07-17 NOTE — MAU Note (Signed)
 Jody Watson is a 28 y.o. at [redacted]w[redacted]d here in MAU reporting: d/c home yesterday - was dizzy and seeing spots before d/c. Today around 1400 started getting a HA. Checked BP - 155/107. Had oxy at 1800 and tylenol  at 1830 - no relief from HA.   LMP: NA Onset of complaint: 1400 Pain score: 6 Vitals:   07/17/23 2026  BP: (!) 141/95  Pulse: (!) 114  Resp: 18  Temp: 98.9 F (37.2 C)  SpO2: 100%     FHT: pt is PP   Lab orders placed from triage: none

## 2023-07-17 NOTE — MAU Provider Note (Addendum)
 Chief Complaint  Patient presents with   Headache   Hypertension     None     S: Jody Watson  is a 28 y.o. y.o. year old [id number] female who is 3 days post Cesarean Section who was discharged yesterday.  In hospital had been seeing spots and feeling dizzy.  presents to MAU with headache and elevated blood pressures. positive Hx GHTN. Took Oxy and ibuprofen  without reliefe.   RN Note:  Jody Watson is a 28 y.o. at [redacted]w[redacted]d here in MAU reporting: d/c home yesterday - was dizzy and seeing spots before d/c. Today around 1400 started getting a HA. Checked BP - 155/107. Had oxy at 1800 and tylenol  at 1830 - no relief from HA. Onset of complaint: 1400 Pain score: 6      O: Patient Vitals for the past 24 hrs:  BP Temp Temp src Pulse Resp SpO2 Height Weight  07/17/23 2225 -- -- -- -- -- 98 % -- --  07/17/23 2220 -- -- -- -- -- 99 % -- --  07/17/23 2216 133/87 -- -- 97 -- -- -- --  07/17/23 2215 -- -- -- -- -- 98 % -- --  07/17/23 2210 -- -- -- -- -- 99 % -- --  07/17/23 2205 -- -- -- -- -- 99 % -- --  07/17/23 2201 (!) 127/92 -- -- (!) 102 -- 100 % -- --  07/17/23 2200 -- -- -- -- -- 100 % -- --  07/17/23 2155 -- -- -- -- -- 100 % -- --  07/17/23 2150 -- -- -- -- -- 100 % -- --  07/17/23 2146 (!) 132/94 -- -- (!) 106 -- -- -- --  07/17/23 2145 -- -- -- -- -- 99 % -- --  07/17/23 2140 -- -- -- -- -- 100 % -- --  07/17/23 2135 -- -- -- -- -- 100 % -- --  07/17/23 2131 (!) 127/93 -- -- (!) 101 -- 100 % -- --  07/17/23 2130 -- -- -- -- -- 100 % -- --  07/17/23 2121 (!) 133/93 -- -- (!) 112 -- -- -- --  07/17/23 2120 -- -- -- -- -- 100 % -- --  07/17/23 2110 -- -- -- -- -- 100 % -- --  07/17/23 2105 -- -- -- -- -- 100 % -- --  07/17/23 2101 (!) 144/95 -- -- (!) 114 -- -- -- --  07/17/23 2100 -- -- -- -- -- 100 % -- --  07/17/23 2045 (!) 143/95 -- -- (!) 112 -- 99 % -- --  07/17/23 2039 (!) 141/95 -- -- (!) 111 -- -- -- --  07/17/23 2026 (!) 141/95 98.9 F (37.2 C) Oral (!) 114 18 100 %  5' 3 (1.6 m) 98.8 kg   General: NAD Heart: Regular rate Lungs: Normal rate and effort Abd: Soft, NT, Extremities: 1+-2+ Pedal edema Neuro: 2+ deep tendon reflexes, No clonus   Results for orders placed or performed during the hospital encounter of 07/17/23 (from the past 24 hours)  CBC     Status: Abnormal   Collection Time: 07/17/23  9:10 PM  Result Value Ref Range   WBC 14.1 (H) 4.0 - 10.5 K/uL   RBC 4.26 3.87 - 5.11 MIL/uL   Hemoglobin 8.7 (L) 12.0 - 15.0 g/dL   HCT 70.0 (L) 63.9 - 53.9 %   MCV 70.2 (L) 80.0 - 100.0 fL   MCH 20.4 (L) 26.0 - 34.0 pg   MCHC 29.1 (L) 30.0 -  36.0 g/dL   RDW 81.7 (H) 88.4 - 84.4 %   Platelets 394 150 - 400 K/uL   nRBC 0.4 (H) 0.0 - 0.2 %  Comprehensive metabolic panel     Status: Abnormal   Collection Time: 07/17/23  9:10 PM  Result Value Ref Range   Sodium 140 135 - 145 mmol/L   Potassium 4.5 3.5 - 5.1 mmol/L   Chloride 108 98 - 111 mmol/L   CO2 24 22 - 32 mmol/L   Glucose, Bld 89 70 - 99 mg/dL   BUN 8 6 - 20 mg/dL   Creatinine, Ser 9.53 0.44 - 1.00 mg/dL   Calcium  8.7 (L) 8.9 - 10.3 mg/dL   Total Protein 5.5 (L) 6.5 - 8.1 g/dL   Albumin 2.5 (L) 3.5 - 5.0 g/dL   AST 49 (H) 15 - 41 U/L   ALT 62 (H) 0 - 44 U/L   Alkaline Phosphatase 93 38 - 126 U/L   Total Bilirubin 0.4 0.0 - 1.2 mg/dL   GFR, Estimated >39 >39 mL/min   Anion gap 8 5 - 15   A:  POD#3, s/p Cesarean Deliveyr      Postpartum preeclampsia with severe features      MIldly elevated Transaminases  P:  Discussed with Dr Armond presentation, exam findings and results      Plan admission with Magnesium  Sulfate for seizure prophylaxis      Patient agreeable to plan      MD to follow  Trudy Earnie CROME, CNM 07/17/2023 10:35 PM

## 2023-07-18 DIAGNOSIS — R519 Headache, unspecified: Secondary | ICD-10-CM | POA: Diagnosis present

## 2023-07-18 DIAGNOSIS — O1415 Severe pre-eclampsia, complicating the puerperium: Principal | ICD-10-CM | POA: Diagnosis present

## 2023-07-18 DIAGNOSIS — O9081 Anemia of the puerperium: Secondary | ICD-10-CM | POA: Diagnosis present

## 2023-07-18 DIAGNOSIS — O9089 Other complications of the puerperium, not elsewhere classified: Secondary | ICD-10-CM | POA: Diagnosis not present

## 2023-07-18 DIAGNOSIS — Z8249 Family history of ischemic heart disease and other diseases of the circulatory system: Secondary | ICD-10-CM | POA: Diagnosis not present

## 2023-07-18 DIAGNOSIS — Z833 Family history of diabetes mellitus: Secondary | ICD-10-CM | POA: Diagnosis not present

## 2023-07-18 LAB — COMPREHENSIVE METABOLIC PANEL WITH GFR
ALT: 61 U/L — ABNORMAL HIGH (ref 0–44)
AST: 42 U/L — ABNORMAL HIGH (ref 15–41)
Albumin: 2.2 g/dL — ABNORMAL LOW (ref 3.5–5.0)
Alkaline Phosphatase: 81 U/L (ref 38–126)
Anion gap: 10 (ref 5–15)
BUN: 9 mg/dL (ref 6–20)
CO2: 24 mmol/L (ref 22–32)
Calcium: 7.7 mg/dL — ABNORMAL LOW (ref 8.9–10.3)
Chloride: 108 mmol/L (ref 98–111)
Creatinine, Ser: 0.53 mg/dL (ref 0.44–1.00)
GFR, Estimated: >60 mL/min (ref >60)
Glucose, Bld: 126 mg/dL — ABNORMAL HIGH (ref 70–99)
Potassium: 3.7 mmol/L (ref 3.5–5.1)
Sodium: 142 mmol/L (ref 135–145)
Total Bilirubin: 0.3 mg/dL (ref 0.0–1.2)
Total Protein: 5.2 g/dL — ABNORMAL LOW (ref 6.5–8.1)

## 2023-07-18 LAB — CBC
HCT: 28.1 % — ABNORMAL LOW (ref 36.0–46.0)
Hemoglobin: 8.1 g/dL — ABNORMAL LOW (ref 12.0–15.0)
MCH: 20.8 pg — ABNORMAL LOW (ref 26.0–34.0)
MCHC: 28.8 g/dL — ABNORMAL LOW (ref 30.0–36.0)
MCV: 72.1 fL — ABNORMAL LOW (ref 80.0–100.0)
Platelets: 390 K/uL (ref 150–400)
RBC: 3.9 MIL/uL (ref 3.87–5.11)
RDW: 18.1 % — ABNORMAL HIGH (ref 11.5–15.5)
WBC: 11.8 K/uL — ABNORMAL HIGH (ref 4.0–10.5)
nRBC: 0.4 % — ABNORMAL HIGH (ref 0.0–0.2)

## 2023-07-18 LAB — TYPE AND SCREEN
ABO/RH(D): O POS
Antibody Screen: NEGATIVE

## 2023-07-18 LAB — MAGNESIUM: Magnesium: 4.1 mg/dL — ABNORMAL HIGH (ref 1.7–2.4)

## 2023-07-18 MED ORDER — IRON SUCROSE 500 MG IVPB - SIMPLE MED
500.0000 mg | Freq: Once | INTRAVENOUS | Status: DC
  Filled 2023-07-18: qty 275

## 2023-07-18 MED ORDER — IBUPROFEN 600 MG PO TABS
600.0000 mg | ORAL_TABLET | Freq: Four times a day (QID) | ORAL | Status: DC | PRN
  Administered 2023-07-18 – 2023-07-19 (×4): 600 mg via ORAL
  Filled 2023-07-18 (×4): qty 1

## 2023-07-18 MED ORDER — LACTATED RINGERS IV SOLN: INTRAVENOUS | Status: AC

## 2023-07-18 MED ORDER — BUTALBITAL-APAP-CAFFEINE 50-325-40 MG PO TABS
1.0000 | ORAL_TABLET | Freq: Four times a day (QID) | ORAL | Status: DC | PRN
  Administered 2023-07-18 (×2): 1 via ORAL
  Filled 2023-07-18 (×2): qty 1

## 2023-07-18 MED ORDER — PRENATAL MULTIVITAMIN CH
1.0000 | ORAL_TABLET | Freq: Every day | ORAL | Status: DC
  Administered 2023-07-18 – 2023-07-19 (×2): 1 via ORAL
  Filled 2023-07-18 (×2): qty 1

## 2023-07-18 MED ORDER — CALCIUM CARBONATE ANTACID 500 MG PO CHEW: 2.0000 | CHEWABLE_TABLET | ORAL | Status: DC | PRN

## 2023-07-18 MED ORDER — SODIUM CHLORIDE 0.9 % IV SOLN
500.0000 mg | Freq: Once | INTRAVENOUS | Status: AC
  Administered 2023-07-19: 500 mg via INTRAVENOUS
  Filled 2023-07-18: qty 25

## 2023-07-18 MED ORDER — ACETAMINOPHEN 325 MG PO TABS
650.0000 mg | ORAL_TABLET | Freq: Four times a day (QID) | ORAL | Status: DC | PRN
  Administered 2023-07-18: 650 mg via ORAL
  Filled 2023-07-18: qty 2

## 2023-07-18 MED ORDER — GABAPENTIN 100 MG PO CAPS: 100.0000 mg | ORAL_CAPSULE | Freq: Two times a day (BID) | ORAL | Status: DC | PRN

## 2023-07-18 MED ORDER — ACETAMINOPHEN 500 MG PO TABS
1000.0000 mg | ORAL_TABLET | Freq: Four times a day (QID) | ORAL | Status: DC | PRN
  Administered 2023-07-18 – 2023-07-19 (×4): 1000 mg via ORAL
  Filled 2023-07-18 (×4): qty 2

## 2023-07-18 MED ORDER — OXYCODONE HCL 5 MG PO TABS
5.0000 mg | ORAL_TABLET | ORAL | Status: DC | PRN
  Administered 2023-07-18 – 2023-07-19 (×3): 10 mg via ORAL
  Filled 2023-07-18 (×3): qty 2

## 2023-07-18 MED ORDER — LACTATED RINGERS IV SOLN: 125.0000 mL/h | INTRAVENOUS | Status: AC

## 2023-07-18 MED ORDER — OXYCODONE HCL 5 MG PO TABS
5.0000 mg | ORAL_TABLET | ORAL | Status: DC | PRN
  Administered 2023-07-18 (×2): 5 mg via ORAL
  Filled 2023-07-18 (×2): qty 1

## 2023-07-18 MED ORDER — DOCUSATE SODIUM 100 MG PO CAPS
100.0000 mg | ORAL_CAPSULE | Freq: Every day | ORAL | Status: DC
  Administered 2023-07-18 – 2023-07-19 (×2): 100 mg via ORAL
  Filled 2023-07-18 (×2): qty 1

## 2023-07-18 MED ORDER — BUTORPHANOL TARTRATE 10 MG/ML NA SOLN
1.0000 | NASAL | Status: DC | PRN
  Administered 2023-07-18: 1 via NASAL
  Filled 2023-07-18: qty 2.5

## 2023-07-18 NOTE — H&P (Signed)
 Jody Watson is a 28 y.o. female presenting for headache with scotoma.  No relief with tylenol , oxy or flexeril .  Pt s/p repeat CS 07/14/23.  Voiding without difficulty.   History OB History     Gravida  3   Para  2   Term  1   Preterm  1   AB  1   Living  3      SAB      IAB  1   Ectopic      Multiple  1   Live Births  3          Past Medical History:  Diagnosis Date   Anemia    none recent   Anxiety    Asthma    no inhalers used, denies current problem   COVID jan 18th 2022   rapid home sob stomach pain n/v diarrhea x 2 weeks allsymptoms resoleved   Depression    Headache    PCOS (polycystic ovarian syndrome)    UTI (urinary tract infection)    Past Surgical History:  Procedure Laterality Date   CESAREAN SECTION     CESAREAN SECTION N/A 07/14/2023   Procedure: CESAREAN DELIVERY;  Surgeon: Barbette Knock, MD;  Location: [facility];  Service: Obstetrics;  Laterality: N/A;  EDD 07/14/23   CESAREAN SECTION MULTI-GESTATIONAL N/A 12/29/2020   Procedure: Primary CESAREAN SECTION MULTI-GESTATIONAL;  Surgeon: Rendell Ruby A, DO;  Location: [facility];  Service: Obstetrics;  Laterality: N/A;  EDD: 02/24/21   DILATION AND EVACUATION N/A 03/11/2020   Procedure: DILATATION AND EVACUATION;  Surgeon: Barbette Knock, MD;  Location: [facility];  Service: Gynecology;  Laterality: N/A;   OPERATIVE ULTRASOUND N/A 03/11/2020   Procedure: OPERATIVE ULTRASOUND;  Surgeon: Barbette Knock, MD;  Location: [facility];  Service: Gynecology;  Laterality: N/A;   TONSILLECTOMY  as child   wisdome teeth extraction  age 70   Family History: family history includes Alcohol abuse in her maternal grandfather; Asthma in her father; Diabetes in her father and paternal grandfather; Fibromyalgia in her mother; Heart disease in her maternal grandfather and paternal grandfather. Social History:  reports that she has never smoked. She has never used smokeless tobacco.  She reports that she does not currently use alcohol. She reports that she does not use drugs.  Review of Systems - Negative except head ache    Blood pressure 132/87, pulse (!) 105, temperature 98.4 F (36.9 C), resp. rate 18, height 5' 3 (1.6 m), weight 98.8 kg, SpO2 98%, currently breastfeeding. Exam Physical Exam  Physical Examination: General appearance - alert, well appearing, and in no distress Chest - clear to auscultation, no wheezes, rales or rhonchi, symmetric air entry Heart - normal rate and regular rhythm Abdomen - soft, nontender, nondistended, no masses or organomegaly Incision CDI Extremities - Homan's sign negative bilaterally  Prenatal labs: ABO, Rh: --/--/O POS (07/11 9090) Antibody: NEG (07/11 0909) Rubella: Immune (01/06 0000) RPR: NON REACTIVE (07/11 0904)  HBsAg: Negative (01/06 0000)  HIV: Non-reactive (01/06 0000)  GBS: Negative/-- (06/12 0000)   Assessment/Plan: S/p CS 07/14/23 now with PE with severe features Start magnesium  fo rseizure prophylaxsis Strict I/O Pain management  for headache and incisional pain Pt agrees with the plan of care Will given antihypertensives if needed Recheck labs in am.    [name] A Wilfrido Luedke 07/18/2023, 12:14 AM

## 2023-07-18 NOTE — Plan of Care (Signed)
  Problem: Education: Goal: Knowledge of disease or condition will improve Outcome: Progressing Goal: Knowledge of the prescribed therapeutic regimen will improve Outcome: Progressing   Problem: Fluid Volume: Goal: Peripheral tissue perfusion will improve Outcome: Progressing   Problem: Clinical Measurements: Goal: Complications related to disease process, condition or treatment will be avoided or minimized Outcome: Progressing   Problem: Education: Goal: Knowledge of General Education information will improve Description: Including pain rating scale, medication(s)/side effects and non-pharmacologic comfort measures Outcome: Progressing   Problem: Health Behavior/Discharge Planning: Goal: Ability to manage health-related needs will improve Outcome: Progressing   Problem: Clinical Measurements: Goal: Ability to maintain clinical measurements within normal limits will improve Outcome: Progressing Goal: Will remain free from infection Outcome: Progressing Goal: Diagnostic test results will improve Outcome: Progressing Goal: Respiratory complications will improve Outcome: Progressing Goal: Cardiovascular complication will be avoided Outcome: Progressing   Problem: Activity: Goal: Risk for activity intolerance will decrease Outcome: Progressing   Problem: Nutrition: Goal: Adequate nutrition will be maintained Outcome: Progressing

## 2023-07-18 NOTE — Progress Notes (Signed)
 Pt states h/a improved to a 2 with the increased dose of oxycodone  Pt reports h/o migraine h/a - says that this h/a is similar to her migraines - started behind left eye then became more all over, now back to feeling it more behind her eye, feels like it may be starting to increase in pain; light was bothersome but this has improved No sob/cp, no n/v Pt does c/o dizziness yesterday, hasn't started oral iron  yet; was supposed to get iv iron  before delivery but was not able to get done   No abd pain breastfeeding  Patient Vitals for the past 24 hrs:  BP Temp Temp src Pulse Resp SpO2 Height Weight  07/18/23 1150 126/81 98.2 F (36.8 C) Oral (!) 104 18 100 % -- --  07/18/23 1100 -- -- -- -- 19 -- -- --  07/18/23 1000 -- -- -- -- 17 -- -- --  07/18/23 0900 -- -- -- -- 18 -- -- --  07/18/23 0754 125/85 98.5 F (36.9 C) Oral 97 17 99 % -- --  07/18/23 0632 128/81 -- -- 94 16 -- -- --  07/18/23 0503 118/84 -- -- 100 16 -- -- --  07/18/23 0402 132/83 -- -- (!) 110 16 -- -- --  07/18/23 0315 -- -- -- -- -- 100 % -- --  07/18/23 0306 124/82 -- -- (!) 102 18 100 % -- --  07/18/23 0158 124/89 -- -- (!) 109 18 -- -- --  07/18/23 0109 (!) 121/93 -- -- 99 16 -- -- --  07/18/23 0017 134/83 -- -- (!) 101 16 97 % -- --  07/17/23 2322 132/87 98.4 F (36.9 C) Oral (!) 105 18 98 % -- --  07/17/23 2250 -- -- -- -- -- 99 % -- --  07/17/23 2246 (!) 144/97 -- -- (!) 107 -- -- -- --  07/17/23 2245 -- -- -- -- -- 100 % -- --  07/17/23 2240 -- -- -- -- -- 100 % -- --  07/17/23 2231 (!) 132/91 -- -- 97 -- -- -- --  07/17/23 2230 -- -- -- -- -- 97 % -- --  07/17/23 2225 -- -- -- -- -- 98 % -- --  07/17/23 2220 -- -- -- -- -- 99 % -- --  07/17/23 2216 133/87 -- -- 97 -- -- -- --  07/17/23 2215 -- -- -- -- -- 98 % -- --  07/17/23 2210 -- -- -- -- -- 99 % -- --  07/17/23 2205 -- -- -- -- -- 99 % -- --  07/17/23 2201 (!) 127/92 -- -- (!) 102 -- 100 % -- --  07/17/23 2200 -- -- -- -- -- 100 % -- --  07/17/23  2155 -- -- -- -- -- 100 % -- --  07/17/23 2150 -- -- -- -- -- 100 % -- --  07/17/23 2146 (!) 132/94 -- -- (!) 106 -- -- -- --  07/17/23 2145 -- -- -- -- -- 99 % -- --  07/17/23 2140 -- -- -- -- -- 100 % -- --  07/17/23 2135 -- -- -- -- -- 100 % -- --  07/17/23 2131 (!) 127/93 -- -- (!) 101 -- 100 % -- --  07/17/23 2130 -- -- -- -- -- 100 % -- --  07/17/23 2121 (!) 133/93 -- -- (!) 112 -- -- -- --  07/17/23 2120 -- -- -- -- -- 100 % -- --  07/17/23 2110 -- -- -- -- -- 100 % -- --  07/17/23 2105 -- -- -- -- -- 100 % -- --  07/17/23 2101 (!) 144/95 -- -- (!) 114 -- -- -- --  07/17/23 2100 -- -- -- -- -- 100 % -- --  07/17/23 2045 (!) 143/95 -- -- (!) 112 -- 99 % -- --  07/17/23 2039 (!) 141/95 -- -- (!) 111 -- -- -- --  07/17/23 2026 (!) 141/95 98.9 F (37.2 C) Oral (!) 114 18 100 % 5' 3 (1.6 m) 98.8 kg   Intake/Output Summary (Last 24 hours) at 07/18/2023 1452 Last data filed at 07/18/2023 1400 Gross per 24 hour  Intake 8390.8 ml  Output 6050 ml  Net 2340.8 ml   A&ox3 Rrr Ctab Abd: +bs, soft,nt,nd; dressing: honeycomb in place, old drainage noted 2+ DTR LE: +1 edema,nt bilat     Latest Ref Rng & Units 07/18/2023    6:35 AM 07/17/2023    9:10 PM 07/07/2023   12:35 PM  CMP  Glucose 70 - 99 mg/dL 873  89  99   BUN 6 - 20 mg/dL 9  8  7    Creatinine 0.44 - 1.00 mg/dL 9.46  9.53  9.43   Sodium 135 - 145 mmol/L 142  140  136   Potassium 3.5 - 5.1 mmol/L 3.7  4.5  3.6   Chloride 98 - 111 mmol/L 108  108  105   CO2 22 - 32 mmol/L 24  24  19    Calcium  8.9 - 10.3 mg/dL 7.7  8.7  8.7   Total Protein 6.5 - 8.1 g/dL 5.2  5.5  6.2   Total Bilirubin 0.0 - 1.2 mg/dL 0.3  0.4  0.4   Alkaline Phos 38 - 126 U/L 81  93  125   AST 15 - 41 U/L 42  49  18   ALT 0 - 44 U/L 61  62  20       Latest Ref Rng & Units 07/18/2023    6:35 AM 07/17/2023    9:10 PM 07/15/2023    4:34 AM  CBC  WBC 4.0 - 10.5 K/uL 11.8  14.1  22.8   Hemoglobin 12.0 - 15.0 g/dL 8.1  8.7  8.0   Hematocrit 36.0 - 46.0 %  28.1  29.9  27.5   Platelets 150 - 400 K/uL 390  394  317      A/P: hd2 pp with pre-ecalmpsia with severe features; pod4 s/p ltcs No s/s magnesium  toxicity, continue for 24 hrs; bps wnl and follow closely; fioricet  didn't help pain - will stop and plan tylenol  1,000mg  q 6 hr, begin ibuprofen  (nsaids normally work for her migraines), oxycodone  for more severe pain; consider head CT/neuro consult if h/a h/a worsens and unable to adequately manage pain; lfts slightly improved/stable and good diuresis; likely d/c home tomorrow Anemia - plan iv iron  d/t dizziness at home, will contin oral iron   after d/c Breastfeeding Post op - ok to remove honeycomb and will re-do steristrips; healing well, no abd pain breastfeeding

## 2023-07-19 DIAGNOSIS — R519 Headache, unspecified: Secondary | ICD-10-CM

## 2023-07-19 DIAGNOSIS — O1415 Severe pre-eclampsia, complicating the puerperium: Principal | ICD-10-CM

## 2023-07-19 DIAGNOSIS — O9089 Other complications of the puerperium, not elsewhere classified: Secondary | ICD-10-CM

## 2023-07-19 LAB — COMPREHENSIVE METABOLIC PANEL WITH GFR
ALT: 56 U/L — ABNORMAL HIGH (ref 0–44)
AST: 30 U/L (ref 15–41)
Albumin: 2.4 g/dL — ABNORMAL LOW (ref 3.5–5.0)
Alkaline Phosphatase: 82 U/L (ref 38–126)
Anion gap: 10 (ref 5–15)
BUN: 8 mg/dL (ref 6–20)
CO2: 21 mmol/L — ABNORMAL LOW (ref 22–32)
Calcium: 8.1 mg/dL — ABNORMAL LOW (ref 8.9–10.3)
Chloride: 104 mmol/L (ref 98–111)
Creatinine, Ser: 0.54 mg/dL (ref 0.44–1.00)
GFR, Estimated: >60 mL/min (ref >60)
Glucose, Bld: 87 mg/dL (ref 70–99)
Potassium: 3.8 mmol/L (ref 3.5–5.1)
Sodium: 135 mmol/L (ref 135–145)
Total Bilirubin: 0.6 mg/dL (ref 0.0–1.2)
Total Protein: 5.6 g/dL — ABNORMAL LOW (ref 6.5–8.1)

## 2023-07-19 LAB — CBC
HCT: 29.4 % — ABNORMAL LOW (ref 36.0–46.0)
Hemoglobin: 8.4 g/dL — ABNORMAL LOW (ref 12.0–15.0)
MCH: 20.4 pg — ABNORMAL LOW (ref 26.0–34.0)
MCHC: 28.6 g/dL — ABNORMAL LOW (ref 30.0–36.0)
MCV: 71.5 fL — ABNORMAL LOW (ref 80.0–100.0)
Platelets: 413 K/uL — ABNORMAL HIGH (ref 150–400)
RBC: 4.11 MIL/uL (ref 3.87–5.11)
RDW: 18.3 % — ABNORMAL HIGH (ref 11.5–15.5)
WBC: 11.9 K/uL — ABNORMAL HIGH (ref 4.0–10.5)
nRBC: 0.3 % — ABNORMAL HIGH (ref 0.0–0.2)

## 2023-07-19 NOTE — Progress Notes (Signed)
 HD 3 Severe pre-e  No c/o; h/a resolved No abd pain or other c/o; ready for d/c home Feels much better afer IV iron   Patient Vitals for the past 24 hrs:  BP Temp Temp src Pulse Resp SpO2  07/19/23 0800 127/83 98.7 F (37.1 C) Oral 94 17 95 %  07/19/23 0509 124/83 98.5 F (36.9 C) Oral 99 16 --  07/18/23 2323 122/79 98.3 F (36.8 C) Oral 86 18 100 %  07/18/23 2300 -- -- -- -- 18 --  07/18/23 2100 -- -- -- -- 17 --  07/18/23 2002 -- -- -- -- 17 --  07/18/23 1954 127/83 98.2 F (36.8 C) Oral 98 19 97 %  07/18/23 1855 -- -- -- -- 17 --  07/18/23 1759 -- -- -- -- 16 --  07/18/23 1700 -- -- -- -- 18 --  07/18/23 1600 -- -- -- -- 17 --  07/18/23 1541 124/76 98.3 F (36.8 C) Oral 93 16 100 %  07/18/23 1500 -- -- -- -- 17 --  07/18/23 1400 -- -- -- -- 15 --  07/18/23 1300 -- -- -- -- 17 --  07/18/23 1150 126/81 98.2 F (36.8 C) Oral (!) 104 18 100 %  07/18/23 1100 -- -- -- -- 19 --  07/18/23 1000 -- -- -- -- 17 --  07/18/23 0900 -- -- -- -- 18 --   Intake/Output Summary (Last 24 hours) at 07/19/2023 0857 Last data filed at 07/19/2023 0509 Gross per 24 hour  Intake 7225.49 ml  Output 7350 ml  Net -124.51 ml     A&ox3 Rrr Ctab Abd: soft,nt,nd, +bs; incision c/d/I LE: tr edema, nt bilat     Latest Ref Rng & Units 07/19/2023    9:15 AM 07/18/2023    6:35 AM 07/17/2023    9:10 PM  CBC  WBC 4.0 - 10.5 K/uL 11.9  11.8  14.1   Hemoglobin 12.0 - 15.0 g/dL 8.4  8.1  8.7   Hematocrit 36.0 - 46.0 % 29.4  28.1  29.9   Platelets 150 - 400 K/uL 413  390  394       Latest Ref Rng & Units 07/19/2023    9:15 AM 07/18/2023    6:35 AM 07/17/2023    9:10 PM  CMP  Glucose 70 - 99 mg/dL 87  873  89   BUN 6 - 20 mg/dL 8  9  8    Creatinine 0.44 - 1.00 mg/dL 9.45  9.46  9.53   Sodium 135 - 145 mmol/L 135  142  140   Potassium 3.5 - 5.1 mmol/L 3.8  3.7  4.5   Chloride 98 - 111 mmol/L 104  108  108   CO2 22 - 32 mmol/L 21  24  24    Calcium  8.9 - 10.3 mg/dL 8.1  7.7  8.7   Total Protein  6.5 - 8.1 g/dL 5.6  5.2  5.5   Total Bilirubin 0.0 - 1.2 mg/dL 0.6  0.3  0.4   Alkaline Phos 38 - 126 U/L 82  81  93   AST 15 - 41 U/L 30  42  49   ALT 0 - 44 U/L 56  61  62    A/P:   Hd3 pp with pre-ecalmpsia with severe features; pod4 s/p ltcs S/p 24 hr mag sulfate, nml bps, good diuresis, no h/a and no other sx; lfts normalizing; d/c home today Anemia - s/p iv iro, f/u pp, contin oral iron  daily Breastfeeding Post  op - keep incision clean/dry, has pain meds at home

## 2023-07-19 NOTE — Plan of Care (Signed)
  Problem: Education: Goal: Knowledge of disease or condition will improve Outcome: Adequate for Discharge Goal: Knowledge of the prescribed therapeutic regimen will improve Outcome: Adequate for Discharge   Problem: Fluid Volume: Goal: Peripheral tissue perfusion will improve Outcome: Adequate for Discharge   Problem: Clinical Measurements: Goal: Complications related to disease process, condition or treatment will be avoided or minimized Outcome: Adequate for Discharge   Problem: Education: Goal: Knowledge of General Education information will improve Description: Including pain rating scale, medication(s)/side effects and non-pharmacologic comfort measures Outcome: Adequate for Discharge   Problem: Health Behavior/Discharge Planning: Goal: Ability to manage health-related needs will improve Outcome: Adequate for Discharge   Problem: Clinical Measurements: Goal: Ability to maintain clinical measurements within normal limits will improve Outcome: Adequate for Discharge Goal: Will remain free from infection Outcome: Adequate for Discharge Goal: Diagnostic test results will improve Outcome: Adequate for Discharge Goal: Respiratory complications will improve Outcome: Adequate for Discharge Goal: Cardiovascular complication will be avoided Outcome: Adequate for Discharge   Problem: Activity: Goal: Risk for activity intolerance will decrease Outcome: Adequate for Discharge   Problem: Nutrition: Goal: Adequate nutrition will be maintained Outcome: Adequate for Discharge   Problem: Coping: Goal: Level of anxiety will decrease Outcome: Adequate for Discharge   Problem: Elimination: Goal: Will not experience complications related to bowel motility Outcome: Adequate for Discharge Goal: Will not experience complications related to urinary retention Outcome: Adequate for Discharge   Problem: Pain Managment: Goal: General experience of comfort will improve and/or be  controlled Outcome: Adequate for Discharge   Problem: Safety: Goal: Ability to remain free from injury will improve Outcome: Adequate for Discharge   Problem: Skin Integrity: Goal: Risk for impaired skin integrity will decrease Outcome: Adequate for Discharge   Problem: Education: Goal: Knowledge of disease or condition will improve Outcome: Adequate for Discharge Goal: Knowledge of the prescribed therapeutic regimen will improve Outcome: Adequate for Discharge Goal: Individualized Educational Video(s) Outcome: Adequate for Discharge   Problem: Clinical Measurements: Goal: Complications related to the disease process, condition or treatment will be avoided or minimized Outcome: Adequate for Discharge

## 2023-07-19 NOTE — Discharge Summary (Signed)
 Physician Discharge Summary  Patient ID: Jody Watson MRN: [id number] DOB/AGE: 29-Apr-1995 28 y.o.  Admit date: 07/17/2023 Discharge date: 07/19/2023  Admission Diagnoses:  Discharge Diagnoses:  Principal Problem:   Hypertension in pregnancy, preeclampsia, severe, delivered/postpartum Active Problems:   Headache in pregnancy, postpartum   Discharged Condition: good  Hospital Course: Patient admitted pod4 s/p c/s with severe pre-eclampsia. She received magnesium  sulfate x24 hrs and bps normalized. No bp meds needed. Lfts noted to be slightly elevated on admission and now normalizing. Her h/a on admission has resolved and she has no c/o today; She will go home and plan f/u 1 wk at office.  Consults: None  Significant Diagnostic Studies: none   Discharge Exam: Blood pressure (!) 131/90, pulse 99, temperature 98 F (36.7 C), temperature source Oral, resp. rate 18, height 5' 3 (1.6 m), weight 98.8 kg, SpO2 100%, currently breastfeeding.   Disposition: Discharge disposition: 01-Home or Self Care       Discharge Instructions     Call MD for:  difficulty breathing, headache or visual disturbances   Complete by: As directed    Call MD for:  extreme fatigue   Complete by: As directed    Call MD for:  hives   Complete by: As directed    Call MD for:  persistant dizziness or light-headedness   Complete by: As directed    Call MD for:  persistant nausea and vomiting   Complete by: As directed    Call MD for:  redness, tenderness, or signs of infection (pain, swelling, redness, odor or green/yellow discharge around incision site)   Complete by: As directed    Call MD for:  severe uncontrolled pain   Complete by: As directed    Call MD for:  temperature >100.4   Complete by: As directed    Diet - low sodium heart healthy   Complete by: As directed       Allergies as of 07/19/2023       Reactions   Bee Venom Anaphylaxis   Tape Dermatitis   Paper tape          Medication List     TAKE these medications    acetaminophen  500 MG tablet Commonly known as: TYLENOL  Take 2 tablets (1,000 mg total) by mouth every 6 (six) hours.   ferrous sulfate 325 (65 FE) MG tablet Take 325 mg by mouth every other day.   ibuprofen  600 MG tablet Commonly known as: ADVIL  Take 1 tablet (600 mg total) by mouth every 6 (six) hours.   oxyCODONE  5 MG immediate release tablet Commonly known as: Oxy IR/ROXICODONE  Take 1-2 tablets (5-10 mg total) by mouth every 4 (four) hours as needed for moderate pain (pain score 4-6).   prenatal multivitamin Tabs tablet Take 1 tablet by mouth daily at 12 noon.   senna-docusate 8.6-50 MG tablet Commonly known as: Senokot-S Take 2 tablets by mouth daily.        Follow-up Information     Barbette Knock, MD Follow up.   Specialty: Obstetrics and Gynecology Contact information: [address] [place] [address] [phone]                 Signed: Devere FORBES Brave 07/19/2023, 12:38 PM

## 2023-07-19 NOTE — Progress Notes (Signed)
   07/19/23 1357  Departure Condition  Departure Condition Good  Mobility at [employer]  Patient/Caregiver Teaching Teach Back Method Used;Discharge instructions reviewed;Prescriptions reviewed;Follow-up care reviewed;Admission discussed;Pain management discussed;Medications discussed;Patient/caregiver verbalized understanding;Educated about hypertension in pregnancy  Departure Mode With significant other  Was procedural sedation performed on this patient during this visit? No   Patient alert and oriented x4, vital signs and pain stable at discharge.

## 2023-07-30 ENCOUNTER — Telehealth (HOSPITAL_COMMUNITY): Payer: Self-pay | Admitting: *Deleted

## 2023-07-30 NOTE — Telephone Encounter (Signed)
 07/30/2023  Name: Jody Watson MRN: [id number] DOB: 11-Jan-1995  Reason for Call:  Transition of Care Hospital Discharge Call  Contact Status: Patient Contact Status: Message  Language assistant needed:          Follow-Up Questions:    Van Postnatal Depression Scale:  In the Past 7 Days:    PHQ2-9 Depression Scale:     Discharge Follow-up:    Post-discharge interventions: NA  Mliss Sieve, RN 07/30/2023 11:11

## 2024-04-03 ENCOUNTER — Encounter: Admitting: Family Medicine
